# Patient Record
Sex: Male | Born: 1980
Health system: Southern US, Community
[De-identification: ages and names within clinical notes are randomized; demographics above are authoritative.]

## PROBLEM LIST (undated history)

## (undated) DIAGNOSIS — M419 Scoliosis, unspecified: Secondary | ICD-10-CM

## (undated) DIAGNOSIS — I10 Essential (primary) hypertension: Secondary | ICD-10-CM

---

## 2007-07-30 ENCOUNTER — Emergency Department (HOSPITAL_COMMUNITY): Admission: EM | Admit: 2007-07-30 | Discharge: 2007-07-30 | Payer: Self-pay | Admitting: Emergency Medicine

## 2010-01-31 ENCOUNTER — Emergency Department (HOSPITAL_COMMUNITY): Admission: EM | Admit: 2010-01-31 | Discharge: 2010-01-31 | Payer: Self-pay | Admitting: Emergency Medicine

## 2013-06-24 ENCOUNTER — Emergency Department (HOSPITAL_COMMUNITY)
Admission: EM | Admit: 2013-06-24 | Discharge: 2013-06-24 | Disposition: A | Discharge status: Home / Self Care | Payer: Self-pay | Attending: Emergency Medicine | Admitting: Emergency Medicine

## 2013-06-24 ENCOUNTER — Encounter (HOSPITAL_COMMUNITY): Payer: Self-pay | Admitting: Adult Health

## 2013-06-24 DIAGNOSIS — F17200 Nicotine dependence, unspecified, uncomplicated: Secondary | ICD-10-CM | POA: Insufficient documentation

## 2013-06-24 DIAGNOSIS — R21 Rash and other nonspecific skin eruption: Secondary | ICD-10-CM | POA: Insufficient documentation

## 2013-06-24 DIAGNOSIS — I10 Essential (primary) hypertension: Secondary | ICD-10-CM | POA: Insufficient documentation

## 2013-06-24 DIAGNOSIS — Z880 Allergy status to penicillin: Secondary | ICD-10-CM | POA: Insufficient documentation

## 2013-06-24 HISTORY — DX: Essential (primary) hypertension: I10

## 2013-06-24 MED ORDER — PREDNISONE 20 MG PO TABS: 40.0000 mg | ORAL_TABLET | Freq: Every day | ORAL | Status: DC

## 2013-06-24 MED ORDER — PERMETHRIN 5 % EX CREA: TOPICAL_CREAM | CUTANEOUS | Status: DC

## 2013-06-24 NOTE — ED Provider Notes (Signed)
CSN: [id number]     Arrival date & time 06/24/13  1800 History  This chart was scribed for non-physician practitioner Jaynie Crumble, PA-C working with Flint Melter, MD by Valera Castle, ED scribe. This patient was seen in room TR05C/TR05C and the patient's care was started at 6:36 PM.    Chief Complaint  Patient presents with  . Rash    The history is provided by the patient. No language interpreter was used.   HPI Comments: Corey Dunn is a 32 y.o. male who presents to the Emergency Department complaining of an itchy generalized rash, onset 10 days ago which is gradually spreading. He states there is a rash on his neck, groin, feet, legs, arms, hands, and back. He denies the presence of the rash in his mouth. His wife/partner has similar rash, but it is less severe. He states the rash looks similar to bug bites. He tried rubbing them down with EtOH, Aquaphor, and dermasil without relief. He states their kids do not appear to have the rash, but that they sleep in the same bed. He states he does have a h/o eczema. Denies any new products or medications. He denies having any other associated symptoms.    Past Medical History  Diagnosis Date  . Hypertension    History reviewed. No pertinent past surgical history. History reviewed. No pertinent family history. History  Substance Use Topics  . Smoking status: Current Every Day Smoker    Types: Cigarettes  . Smokeless tobacco: Not on file  . Alcohol Use: Yes    Review of Systems  Skin: Positive for rash.  All other systems reviewed and are negative.    Allergies  Penicillins  Home Medications   Current Outpatient Rx  Name  Route  Sig  Dispense  Refill  . calcium carbonate (TUMS - DOSED IN MG ELEMENTAL CALCIUM) 500 MG chewable tablet   Oral   Chew 1 tablet by mouth daily as needed for heartburn.         . cetirizine (ZYRTEC) 10 MG tablet   Oral   Take 10 mg by mouth daily as needed for allergies.         Marland Kitchen  ibuprofen (ADVIL,MOTRIN) 200 MG tablet   Oral   Take 800 mg by mouth every 6 (six) hours as needed for pain.          Triage Vitals: BP 138/90  Pulse 92  Temp(Src) 98.1 F (36.7 C) (Oral)  Resp 20  SpO2 97%  Physical Exam  Nursing note and vitals reviewed. Constitutional: He is oriented to person, place, and time. He appears well-developed and well-nourished.  HENT:  Head: Normocephalic and atraumatic.  No oral mucosa involvement.  Eyes: EOM are normal.  Neck: Normal range of motion. Neck supple.  Cardiovascular: Normal rate.   Pulmonary/Chest: Effort normal.  Musculoskeletal: Normal range of motion.  Neurological: He is alert and oriented to person, place, and time.  Skin: Skin is warm and dry.  Papular erythematous rash to bilateral hands webs of the fingers, axilla, back of the neck, bilateral groin, and bilateral feet and ankles.   Psychiatric: He has a normal mood and affect. His behavior is normal.    ED Course  Procedures (including critical care time) DIAGNOSTIC STUDIES: Oxygen Saturation is 97% on room air, normal by my interpretation.    COORDINATION OF CARE: 6:41 PM-Discussed treatment plan which includes prescriptions for prednisone and permethrin with pt at bedside and pt agreed to plan. Advised pt  that it could be eczema or scabies, and that scabies is contagious. Advised pt to stop applying EtOH to his skin. Advised pt to continue using aquaphor. Advised pt to f/u with a dermatologist if the rash does not improve.      Labs Review Labs Reviewed - No data to display Imaging Review No results found.  MDM   1. Rash    Patient with concerning rash for scabies. He's got pruritic papular rash to hands wrists, ankles, feet, groin, axilla. Patient also has history of eczema and Aleve this could be related. Will treat his rash with permethrin and prednisone. He is otherwise nontoxic appearing. He is afebrile. There is no meningismus. He does not appear ill  otherwise. We'll discharge him home with close followup.   Filed Vitals:   06/24/13 1804 06/24/13 1808  BP: 138/90   Pulse: 92   Temp:  98.1 F (36.7 C)  TempSrc: Oral Oral  Resp: 20   SpO2: 97%    I personally performed the services described in this documentation, which was scribed in my presence. The recorded information has been reviewed and is accurate.    Lottie Mussel, PA-C 06/24/13 1900

## 2013-06-24 NOTE — ED Notes (Signed)
Reports 10 days of itchy rash to hands, legs and back. Denies any one else in home with rash.

## 2013-06-25 NOTE — ED Provider Notes (Signed)
Medical screening examination/treatment/procedure(s) were performed by non-physician practitioner and as supervising physician I was immediately available for consultation/collaboration.  Flint Melter, MD 06/25/13 [phone]

## 2013-08-01 ENCOUNTER — Emergency Department (HOSPITAL_COMMUNITY): Payer: Self-pay

## 2013-08-01 ENCOUNTER — Encounter (HOSPITAL_COMMUNITY): Payer: Self-pay | Admitting: Emergency Medicine

## 2013-08-01 ENCOUNTER — Emergency Department (HOSPITAL_COMMUNITY)
Admission: EM | Admit: 2013-08-01 | Discharge: 2013-08-01 | Disposition: A | Discharge status: Home / Self Care | Payer: Self-pay | Attending: Emergency Medicine | Admitting: Emergency Medicine

## 2013-08-01 DIAGNOSIS — F17200 Nicotine dependence, unspecified, uncomplicated: Secondary | ICD-10-CM | POA: Insufficient documentation

## 2013-08-01 DIAGNOSIS — R0789 Other chest pain: Secondary | ICD-10-CM

## 2013-08-01 DIAGNOSIS — J40 Bronchitis, not specified as acute or chronic: Secondary | ICD-10-CM

## 2013-08-01 DIAGNOSIS — J209 Acute bronchitis, unspecified: Secondary | ICD-10-CM | POA: Insufficient documentation

## 2013-08-01 DIAGNOSIS — I10 Essential (primary) hypertension: Secondary | ICD-10-CM | POA: Insufficient documentation

## 2013-08-01 DIAGNOSIS — Z791 Long term (current) use of non-steroidal anti-inflammatories (NSAID): Secondary | ICD-10-CM | POA: Insufficient documentation

## 2013-08-01 DIAGNOSIS — M549 Dorsalgia, unspecified: Secondary | ICD-10-CM | POA: Insufficient documentation

## 2013-08-01 DIAGNOSIS — Z880 Allergy status to penicillin: Secondary | ICD-10-CM | POA: Insufficient documentation

## 2013-08-01 LAB — CBC WITH DIFFERENTIAL/PLATELET
Basophils Absolute: 0 10*3/uL (ref 0.0–0.1)
Basophils Relative: 0 % (ref 0–1)
Eosinophils Absolute: 0.1 10*3/uL (ref 0.0–0.7)
MCH: 34.9 pg — ABNORMAL HIGH (ref 26.0–34.0)
MCHC: 36.9 g/dL — ABNORMAL HIGH (ref 30.0–36.0)
Monocytes Relative: 10 % (ref 3–12)
Neutrophils Relative %: 63 % (ref 43–77)
Platelets: 211 10*3/uL (ref 150–400)
RDW: 13.4 % (ref 11.5–15.5)

## 2013-08-01 LAB — POCT I-STAT, CHEM 8
Glucose, Bld: 112 mg/dL — ABNORMAL HIGH (ref 70–99)
HCT: 44 % (ref 39.0–52.0)
Hemoglobin: 15 g/dL (ref 13.0–17.0)
Potassium: 3.6 mEq/L (ref 3.5–5.1)
Sodium: 142 mEq/L (ref 135–145)

## 2013-08-01 MED ORDER — AZITHROMYCIN 250 MG PO TABS: ORAL_TABLET | ORAL | Status: DC

## 2013-08-01 MED ORDER — ACETAMINOPHEN-CODEINE #3 300-30 MG PO TABS: 1.0000 | ORAL_TABLET | Freq: Four times a day (QID) | ORAL | Status: DC | PRN

## 2013-08-01 NOTE — ED Notes (Addendum)
C/o cough, CP and back pain, also sob, pain worse with certain positions, movement and cough. LS CTA. Mentions tingling in L arm. Alert, NAD, calm, interactive, skin W&D, resps e/u, speaking in clear complete sentences.

## 2013-08-01 NOTE — ED Notes (Signed)
MD aware of blood pressure trends.

## 2013-08-01 NOTE — ED Notes (Signed)
Pt here for c/o chest pain starting four days ago, pt states he awoke with chest pain sharp and radiating to back. Pt states he was having SOB and was sweating but currently is not.

## 2013-08-01 NOTE — ED Provider Notes (Signed)
CSN: [id number]     Arrival date & time 08/01/13  2135 History   First MD Initiated Contact with Patient 08/01/13 2149     Chief Complaint  Patient presents with  . Chest Pain  . Back Pain  . Cough  . Shortness of Breath   (Consider location/radiation/quality/duration/timing/severity/associated sxs/prior Treatment) HPI Comments: Patient presents with a several day history of chest congestion and cough. He states that the pain is in his left shoulder blade and left anterior chest. He reports low-grade fevers at home. His cough is productive of a yellowish sputum. He has no prior cardiac history. He denies any exertional symptoms. He does have a history of hypertension and is to be taking an ACE inhibitor. He has not taken this in a while as he has no doctor and cannot afford it.  Patient is a 32 y.o. male presenting with chest pain, back pain, cough, and shortness of breath. The history is provided by the patient.  Chest Pain Pain location:  L chest Pain quality: burning   Pain radiates to:  Does not radiate Pain radiates to the back: no   Pain severity:  Moderate Onset quality:  Gradual Duration:  5 days Timing:  Constant Progression:  Worsening Chronicity:  New Context comment:  Coughing Relieved by:  Nothing Worsened by:  Nothing tried Associated symptoms: back pain, cough and shortness of breath   Back Pain Associated symptoms: chest pain   Cough Associated symptoms: chest pain and shortness of breath   Shortness of Breath Associated symptoms: chest pain and cough     Past Medical History  Diagnosis Date  . Hypertension    History reviewed. No pertinent past surgical history. History reviewed. No pertinent family history. History  Substance Use Topics  . Smoking status: Current Every Day Smoker    Types: Cigarettes  . Smokeless tobacco: Not on file  . Alcohol Use: Yes    Review of Systems  Respiratory: Positive for cough and shortness of breath.    Cardiovascular: Positive for chest pain.  Musculoskeletal: Positive for back pain.  All other systems reviewed and are negative.    Allergies  Penicillins  Home Medications   Current Outpatient Rx  Name  Route  Sig  Dispense  Refill  . cetirizine (ZYRTEC) 10 MG tablet   Oral   Take 10 mg by mouth daily as needed for allergies.         Marland Kitchen ibuprofen (ADVIL,MOTRIN) 200 MG tablet   Oral   Take 200 mg by mouth every 6 (six) hours as needed for pain.          . naproxen sodium (ANAPROX) 220 MG tablet   Oral   Take 220 mg by mouth 2 (two) times daily with a meal.         . acetaminophen-codeine (TYLENOL #3) 300-30 MG per tablet   Oral   Take 1-2 tablets by mouth every 6 (six) hours as needed for pain.   15 tablet   0   . azithromycin (ZITHROMAX Z-PAK) 250 MG tablet      2 po day one, then 1 daily x 4 days   6 tablet   0    BP 168/106  Pulse 91  Temp(Src) 99.9 F (37.7 C) (Oral)  Resp 18  Ht 5\' 5"  (1.651 m)  Wt 170 lb (77.111 kg)  BMI 28.29 kg/m2  SpO2 98% Physical Exam  Nursing note and vitals reviewed. Constitutional: He is oriented to person, place, and  time. He appears well-developed and well-nourished. No distress.  HENT:  Head: Normocephalic and atraumatic.  Mouth/Throat: Oropharynx is clear and moist.  Neck: Normal range of motion. Neck supple.  Cardiovascular: Normal rate, regular rhythm and normal heart sounds.   No murmur heard. Pulmonary/Chest: Effort normal and breath sounds normal. No respiratory distress. He has no wheezes.  Abdominal: Soft. Bowel sounds are normal. He exhibits no distension. There is no tenderness.  Musculoskeletal: Normal range of motion. He exhibits no edema.  Neurological: He is alert and oriented to person, place, and time.  Skin: Skin is warm and dry. He is not diaphoretic.    ED Course  Procedures (including critical care time) Labs Review Labs Reviewed  CBC WITH DIFFERENTIAL - Abnormal; Notable for the following:     MCH 34.9 (*)    MCHC 36.9 (*)    All other components within normal limits  POCT I-STAT, CHEM 8 - Abnormal; Notable for the following:    Creatinine, Ser 1.40 (*)    Glucose, Bld 112 (*)    All other components within normal limits  POCT I-STAT TROPONIN I   Imaging Review Dg Chest 2 View  08/01/2013   CLINICAL DATA:  Chest and back pain for 4 days  EXAM: CHEST  2 VIEW  COMPARISON:  None.  FINDINGS: Generous heart size, size likely accentuated by scoliotic curvature and mild thoracic deformity. Mild linear opacities at the right base, were there is minimal posterior costophrenic sulcus blunting. No edema, infiltrate, or pneumothorax.  Thoracic dextroscoliosis, centered at T9-10.  IMPRESSION: 1. Mild right base atelectasis or scarring. 2. Thoracolumbar dextroscoliosis.   Electronically Signed   By: Tiburcio Pea M.D.   On: 08/01/2013 22:22    EKG Interpretation     Ventricular Rate:  110 PR Interval:  172 QRS Duration: 86 QT Interval:  314 QTC Calculation: 424 R Axis:   99 Text Interpretation:  Sinus tachycardia Possible Left atrial enlargement Rightward axis Cannot rule out Anterior infarct , age undetermined Abnormal ECG            MDM   1. Bronchitis   2. Atypical chest pain    Patient presents here with complaints of chest pain, low-grade fever, and productive cough. He is febrile upon presentation but chest x-ray does not reveal an infiltrate. I suspect his symptoms are infectious in nature due to the productive cough and fever however he expressed some concern that there may be a cardiac issue appear there are apparently some cardiac issues around his family but he is not sure what they are. I advised him if he is not improving I will give him contact information for [facility] cardiology that he can call and arrange a followup appointment if not improving.    Geoffery Lyons, MD 08/01/13 2340

## 2013-12-15 ENCOUNTER — Encounter (HOSPITAL_COMMUNITY): Payer: Self-pay | Admitting: Emergency Medicine

## 2013-12-15 ENCOUNTER — Emergency Department (HOSPITAL_COMMUNITY)
Admission: EM | Admit: 2013-12-15 | Discharge: 2013-12-15 | Disposition: A | Discharge status: Home / Self Care | Payer: Self-pay | Attending: Emergency Medicine | Admitting: Emergency Medicine

## 2013-12-15 DIAGNOSIS — Z880 Allergy status to penicillin: Secondary | ICD-10-CM | POA: Insufficient documentation

## 2013-12-15 DIAGNOSIS — K047 Periapical abscess without sinus: Secondary | ICD-10-CM | POA: Insufficient documentation

## 2013-12-15 DIAGNOSIS — F17200 Nicotine dependence, unspecified, uncomplicated: Secondary | ICD-10-CM | POA: Insufficient documentation

## 2013-12-15 DIAGNOSIS — I10 Essential (primary) hypertension: Secondary | ICD-10-CM | POA: Insufficient documentation

## 2013-12-15 MED ORDER — HYDROCODONE-ACETAMINOPHEN 5-325 MG PO TABS: 1.0000 | ORAL_TABLET | ORAL | Status: DC | PRN

## 2013-12-15 MED ORDER — CLINDAMYCIN HCL 150 MG PO CAPS
300.0000 mg | ORAL_CAPSULE | Freq: Once | ORAL | Status: AC
  Administered 2013-12-15: 300 mg via ORAL
  Filled 2013-12-15: qty 2

## 2013-12-15 MED ORDER — CLINDAMYCIN HCL 300 MG PO CAPS: 300.0000 mg | ORAL_CAPSULE | Freq: Four times a day (QID) | ORAL | Status: DC

## 2013-12-15 NOTE — Discharge Instructions (Signed)
Abscessed Tooth  An abscessed tooth is an infection around your tooth. It may be caused by holes or damage to the tooth (cavity) or a dental disease. An abscessed tooth causes mild to very bad pain in and around the tooth. See your dentist right away if you have tooth or gum pain.  HOME CARE   Take your medicine as told. Finish it even if you start to feel better.   Do not drive after taking pain medicine.   Rinse your mouth (gargle) often with salt water ( teaspoon salt in 8 ounces of warm water).   Do not apply heat to the outside of your face.  GET HELP RIGHT AWAY IF:    You have a temperature by mouth above 102 F (38.9 C), not controlled by medicine.   You have chills and a very bad headache.   You have problems breathing or swallowing.   Your mouth will not open.   You develop puffiness (swelling) on the neck or around the eye.   Your pain is not helped by medicine.   Your pain is getting worse instead of better.  MAKE SURE YOU:    Understand these instructions.   Will watch your condition.   Will get help right away if you are not doing well or get worse.  Document Released: 03/25/2008 Document Revised: 12/30/2011 Document Reviewed: 01/15/2011  ExitCare Patient Information 2014 [employer].

## 2013-12-15 NOTE — ED Notes (Signed)
States he has a cyst on his gum

## 2013-12-17 NOTE — ED Provider Notes (Signed)
CSN: [id number]     Arrival date & time 12/15/13  2009 History   First MD Initiated Contact with Patient 12/15/13 2015     Chief Complaint  Patient presents with  . Dental Pain     (Consider location/radiation/quality/duration/timing/severity/associated sxs/prior Treatment) HPI Comments: Corey Dunn is a 33 y.o. Male presenting with a 2 day history of dental pain and gingival swelling.   The patient has a history of  decay in his left lower 2nd premolar tooth which has recently started to cause increased  Pain and now swelling of the gumline.   There has been no fevers, chills, nausea or vomiting, also no complaint of difficulty swallowing, although chewing makes pain worse.  The patient has taken tylenol without relief of symptoms.         The history is provided by the patient.    Past Medical History  Diagnosis Date  . Hypertension    History reviewed. No pertinent past surgical history. History reviewed. No pertinent family history. History  Substance Use Topics  . Smoking status: Current Every Day Smoker    Types: Cigarettes  . Smokeless tobacco: Not on file  . Alcohol Use: Yes    Review of Systems  Constitutional: Negative for fever.  HENT: Positive for dental problem. Negative for facial swelling and sore throat.   Respiratory: Negative for shortness of breath.   Musculoskeletal: Negative for neck pain and neck stiffness.      Allergies  Penicillins  Home Medications   Current Outpatient Rx  Name  Route  Sig  Dispense  Refill  . clindamycin (CLEOCIN) 300 MG capsule   Oral   Take 1 capsule (300 mg total) by mouth every 6 (six) hours.   40 capsule   0   . HYDROcodone-acetaminophen (NORCO/VICODIN) 5-325 MG per tablet   Oral   Take 1 tablet by mouth every 4 (four) hours as needed for moderate pain.   15 tablet   0    BP 163/97  Pulse 88  Temp(Src) 97.9 F (36.6 C) (Oral)  Resp 20  Ht 5' 4.5" (1.638 m)  Wt 160 lb (72.576 kg)  BMI 27.05 kg/m2   SpO2 100% Physical Exam  Constitutional: He is oriented to person, place, and time. He appears well-developed and well-nourished. No distress.  HENT:  Head: Normocephalic and atraumatic.  Right Ear: Tympanic membrane and external ear normal.  Left Ear: Tympanic membrane and external ear normal.  Mouth/Throat: Oropharynx is clear and moist and mucous membranes are normal. No oral lesions. Dental abscesses present.  Deep cavity in occlusive surface of left lower 2nd premolar.  Gingival edema and fluctuance noted in the empty space behind this tooth (1st molar extracted).  No drainage.  Eyes: Conjunctivae are normal.  Neck: Normal range of motion. Neck supple.  Cardiovascular: Normal rate and normal heart sounds.   Pulmonary/Chest: Effort normal.  Abdominal: He exhibits no distension.  Musculoskeletal: Normal range of motion.  Lymphadenopathy:    He has no cervical adenopathy.  Neurological: He is alert and oriented to person, place, and time.  Skin: Skin is warm and dry. No erythema.  Psychiatric: He has a normal mood and affect.    ED Course  Dental Date/Time: 12/15/2013 8:30 PM Performed by: Burgess Amor Authorized by: Burgess Amor Risks and benefits: risks, benefits and alternatives were discussed Consent given by: patient Patient identity confirmed: verbally with patient Time out: Immediately prior to procedure a "time out" was called to verify the correct patient,  procedure, equipment, support staff and site/side marked as required. Comments: Patient given topical lidocaine gel.  Once numb,  Used #11 blade and made small incision at abscess site.  Moderate amount of purulent drainage obtained.  Pt tolerated well.   (including critical care time)   Labs Review Labs Reviewed - No data to display Imaging Review No results found.  EKG Interpretation   None       MDM   Final diagnoses:  Dental abscess    Pt prescribed clindamycin,hydrocodone.  Dental referrals  given.  The patient appears reasonably screened and/or stabilized for discharge and I doubt any other medical condition or other [facility] requiring further screening, evaluation, or treatment in the ED at this time prior to discharge.     Burgess AmorJulie Daisuke Bailey, PA-C 12/17/13 [phone]

## 2013-12-21 NOTE — ED Provider Notes (Signed)
Medical screening examination/treatment/procedure(s) were performed by non-physician practitioner and as supervising physician I was immediately available for consultation/collaboration.   EKG Interpretation None        Meya Clutter, MD 12/21/13 1605 

## 2014-04-01 ENCOUNTER — Encounter (HOSPITAL_COMMUNITY): Payer: Self-pay | Admitting: Emergency Medicine

## 2014-04-01 ENCOUNTER — Emergency Department (HOSPITAL_COMMUNITY)
Admission: EM | Admit: 2014-04-01 | Discharge: 2014-04-01 | Disposition: A | Discharge status: Home / Self Care | Payer: Self-pay | Attending: Emergency Medicine | Admitting: Emergency Medicine

## 2014-04-01 DIAGNOSIS — Z880 Allergy status to penicillin: Secondary | ICD-10-CM | POA: Insufficient documentation

## 2014-04-01 DIAGNOSIS — S6390XA Sprain of unspecified part of unspecified wrist and hand, initial encounter: Secondary | ICD-10-CM | POA: Insufficient documentation

## 2014-04-01 DIAGNOSIS — I10 Essential (primary) hypertension: Secondary | ICD-10-CM | POA: Insufficient documentation

## 2014-04-01 DIAGNOSIS — W230XXA Caught, crushed, jammed, or pinched between moving objects, initial encounter: Secondary | ICD-10-CM | POA: Insufficient documentation

## 2014-04-01 DIAGNOSIS — S63619A Unspecified sprain of unspecified finger, initial encounter: Secondary | ICD-10-CM

## 2014-04-01 DIAGNOSIS — S6000XA Contusion of unspecified finger without damage to nail, initial encounter: Secondary | ICD-10-CM | POA: Insufficient documentation

## 2014-04-01 DIAGNOSIS — Y9389 Activity, other specified: Secondary | ICD-10-CM | POA: Insufficient documentation

## 2014-04-01 DIAGNOSIS — F17200 Nicotine dependence, unspecified, uncomplicated: Secondary | ICD-10-CM | POA: Insufficient documentation

## 2014-04-01 DIAGNOSIS — Y929 Unspecified place or not applicable: Secondary | ICD-10-CM | POA: Insufficient documentation

## 2014-04-01 DIAGNOSIS — Z792 Long term (current) use of antibiotics: Secondary | ICD-10-CM | POA: Insufficient documentation

## 2014-04-01 MED ORDER — IBUPROFEN 400 MG PO TABS
600.0000 mg | ORAL_TABLET | Freq: Once | ORAL | Status: AC
  Administered 2014-04-01: 600 mg via ORAL
  Filled 2014-04-01: qty 2

## 2014-04-01 MED ORDER — IBUPROFEN 600 MG PO TABS: 600.0000 mg | ORAL_TABLET | Freq: Four times a day (QID) | ORAL | Status: DC | PRN

## 2014-04-01 NOTE — Discharge Instructions (Signed)
Finger Sprain A finger sprain is a tear in one of the strong, fibrous tissues that connect the bones (ligaments) in your finger. The severity of the sprain depends on how much of the ligament is torn. The tear can be either partial or complete. CAUSES  Often, sprains are a result of a fall or accident. If you extend your hands to catch an object or to protect yourself, the force of the impact causes the fibers of your ligament to stretch too much. This excess tension causes the fibers of your ligament to tear. SYMPTOMS  You may have some loss of motion in your finger. Other symptoms include:  Bruising.  Tenderness.  Swelling. DIAGNOSIS  In order to diagnose finger sprain, your caregiver will physically examine your finger or thumb to determine how torn the ligament is. Your caregiver may also suggest an X-ray exam of your finger to make sure no bones are broken. TREATMENT  If your ligament is only partially torn, treatment usually involves keeping the finger in a fixed position (immobilization) for a short period. To do this, your caregiver will apply a bandage, cast, or splint to keep your finger from moving until it heals. For a partially torn ligament, the healing process usually takes 2 to 3 weeks. If your ligament is completely torn, you may need surgery to reconnect the ligament to the bone. After surgery a cast or splint will be applied and will need to stay on your finger or thumb for 4 to 6 weeks while your ligament heals. HOME CARE INSTRUCTIONS  Keep your injured finger elevated, when possible, to decrease swelling.  To ease pain and swelling, apply ice to your joint twice a day, for 2 to 3 days:  Put ice in a plastic bag.  Place a towel between your skin and the bag.  Leave the ice on for 15 minutes.  Only take over-the-counter or prescription medicine for pain as directed by your caregiver.  Do not wear rings on your injured finger.  Do not leave your finger unprotected  until pain and stiffness go away (usually 3 to 4 weeks).  Do not allow your cast or splint to get wet. Cover your cast or splint with a plastic bag when you shower or bathe. Do not swim.  Your caregiver may suggest special exercises for you to do during your recovery to prevent or limit permanent stiffness. SEEK IMMEDIATE MEDICAL CARE IF:  Your cast or splint becomes damaged.  Your pain becomes worse rather than better. MAKE SURE YOU:  Understand these instructions.  Will watch your condition.  Will get help right away if you are not doing well or get worse. Document Released: 11/14/2004 Document Revised: 12/30/2011 Document Reviewed: 06/10/2011 [facility] Patient Information 2014 [place]Care, [place].  Contusion A contusion is a deep bruise. Contusions are the result of an injury that caused bleeding under the skin. The contusion may turn blue, purple, or yellow. Minor injuries will give you a painless contusion, but more severe contusions may stay painful and swollen for a few weeks.  CAUSES  A contusion is usually caused by a blow, trauma, or direct force to an area of the body. SYMPTOMS   Swelling and redness of the injured area.  Bruising of the injured area.  Tenderness and soreness of the injured area.  Pain. DIAGNOSIS  The diagnosis can be made by taking a history and physical exam. An X-ray, CT scan, or MRI may be needed to determine if there were any associated  injuries, such as fractures. TREATMENT  Specific treatment will depend on what area of the body was injured. In general, the best treatment for a contusion is resting, icing, elevating, and applying cold compresses to the injured area. Over-the-counter medicines may also be recommended for pain control. Ask your caregiver what the best treatment is for your contusion. HOME CARE INSTRUCTIONS   Put ice on the injured area.  Put ice in a plastic bag.  Place a towel between your skin and the bag.  Leave the ice on  for 15-20 minutes, 03-04 times a day.  Only take over-the-counter or prescription medicines for pain, discomfort, or fever as directed by your caregiver. Your caregiver may recommend avoiding anti-inflammatory medicines (aspirin, ibuprofen, and naproxen) for 48 hours because these medicines may increase bruising.  Rest the injured area.  If possible, elevate the injured area to reduce swelling. SEEK IMMEDIATE MEDICAL CARE IF:   You have increased bruising or swelling.  You have pain that is getting worse.  Your swelling or pain is not relieved with medicines. MAKE SURE YOU:   Understand these instructions.  Will watch your condition.  Will get help right away if you are not doing well or get worse. Document Released: 07/17/2005 Document Revised: 12/30/2011 Document Reviewed: 08/12/2011 [facility]ExitCare Patient Information 2014 [place]Care, [place]. RICE: Routine Care for Injuries The routine care of many injuries includes Rest, Ice, Compression, and Elevation (RICE). HOME CARE INSTRUCTIONS  Rest is needed to allow your body to heal. Routine activities can usually be resumed when comfortable. Injured tendons and bones can take up to 6 weeks to heal. Tendons are the cord-like structures that attach muscle to bone.  Ice following an injury helps keep the swelling down and reduces pain.  Put ice in a plastic bag.  Place a towel between your skin and the bag.  Leave the ice on for 15-20 minutes, 03-04 times a day. Do this while awake, for the first 24 to 48 hours. After that, continue as directed by your caregiver.  Compression helps keep swelling down. It also gives support and helps with discomfort. If an elastic bandage has been applied, it should be removed and reapplied every 3 to 4 hours. It should not be applied tightly, but firmly enough to keep swelling down. Watch fingers or toes for swelling, bluish discoloration, coldness, numbness, or excessive pain. If any of these problems occur,  remove the bandage and reapply loosely. Contact your caregiver if these problems continue.  Elevation helps reduce swelling and decreases pain. With extremities, such as the arms, hands, legs, and feet, the injured area should be placed near or above the level of the heart, if possible. SEEK IMMEDIATE MEDICAL CARE IF:  You have persistent pain and swelling.  You develop redness, numbness, or unexpected weakness.  Your symptoms are getting worse rather than improving after several days. These symptoms may indicate that further evaluation or further X-rays are needed. Sometimes, X-rays may not show a small broken bone (fracture) until 1 week or 10 days later. Make a follow-up appointment with your caregiver. Ask when your X-ray results will be ready. Make sure you get your X-ray results. Document Released: 01/19/2001 Document Revised: 12/30/2011 Document Reviewed: 03/08/2011 [facility]ErExitCare Patient Information 2014 [place]ExitCare, [place]LLC.

## 2014-04-01 NOTE — ED Provider Notes (Signed)
CSN: [id number]     Arrival date & time 04/01/14  0114 History   First MD Initiated Contact with Patient 04/01/14 0157     No chief complaint on file.    (Consider location/radiation/quality/duration/timing/severity/associated sxs/prior Treatment) HPI Comments: Pt comes in with right long finger injury. States that he jammed the finger few days bag while fluffing the pillow. Pt has pain to the finger, mostly over the joints. Able to move over the joint fine. + mild swelling.   The history is provided by the patient.    Past Medical History  Diagnosis Date  . Hypertension    History reviewed. No pertinent past surgical history. History reviewed. No pertinent family history. History  Substance Use Topics  . Smoking status: Current Every Day Smoker -- 1.00 packs/day    Types: Cigarettes  . Smokeless tobacco: Not on file  . Alcohol Use: Yes    Review of Systems  Musculoskeletal: Positive for arthralgias and myalgias.  Skin: Negative for wound.      Allergies  Penicillins  Home Medications   Prior to Admission medications   Medication Sig Start Date End Date Taking? Authorizing Provider  clindamycin (CLEOCIN) 300 MG capsule Take 1 capsule (300 mg total) by mouth every 6 (six) hours. 12/15/13   Burgess AmorJulie Idol, PA-C  HYDROcodone-acetaminophen (NORCO/VICODIN) 5-325 MG per tablet Take 1 tablet by mouth every 4 (four) hours as needed for moderate pain. 12/15/13   Burgess AmorJulie Idol, PA-C  ibuprofen (ADVIL,MOTRIN) 600 MG tablet Take 1 tablet (600 mg total) by mouth every 6 (six) hours as needed. 04/01/14   Kenley Troop, MD   BP 153/106  Pulse 82  Resp 20  SpO2 97% Physical Exam  Nursing note and vitals reviewed. Neck: Neck supple.  Pulmonary/Chest: Effort normal.  Musculoskeletal:  Right hand - long finger has PIP tenderness and swelling. ROM over pip is fine. Cap refill is normal.  Neurological: He is alert.    ED Course  Procedures (including critical care time) Labs  Review Labs Reviewed - No data to display  Imaging Review No results found.   EKG Interpretation None      MDM   Final diagnoses:  Contusion of finger, right  Finger sprain    SPLINT APPLICATION Date/Time: 4:33 AM Authorized by: Derwood KaplanNanavati, Arcadia Gorgas Consent: Verbal consent obtained. Risks and benefits: risks, benefits and alternatives were discussed Consent given by: patient Splint applied by: RN Location details: Right long finger Splint type: Static finger splint Supplies used: splint, tape Post-procedure: The splinted body part was neurovascularly unchanged following the procedure. Patient tolerance: Patient tolerated the procedure well with no immediate complications.   Pt has what appears to be a simple PIP contusion/ soft tissue sprain/strain. Will splint, RICE advised. No reason for Xrays given no gross deformity, and intact ROM and no change in tx even if there is a small fx.   Derwood KaplanAnkit Mico Spark, MD 04/01/14 [phone]

## 2014-04-01 NOTE — ED Notes (Signed)
Pt reports jamming his finger on Tuesday. States he continues to have pain in right middle finger.

## 2014-04-19 ENCOUNTER — Emergency Department (HOSPITAL_COMMUNITY)
Admission: EM | Admit: 2014-04-19 | Discharge: 2014-04-19 | Disposition: A | Discharge status: Home / Self Care | Payer: Self-pay | Attending: Emergency Medicine | Admitting: Emergency Medicine

## 2014-04-19 ENCOUNTER — Encounter (HOSPITAL_COMMUNITY): Payer: Self-pay | Admitting: Emergency Medicine

## 2014-04-19 DIAGNOSIS — F17200 Nicotine dependence, unspecified, uncomplicated: Secondary | ICD-10-CM | POA: Insufficient documentation

## 2014-04-19 DIAGNOSIS — Z791 Long term (current) use of non-steroidal anti-inflammatories (NSAID): Secondary | ICD-10-CM | POA: Insufficient documentation

## 2014-04-19 DIAGNOSIS — R42 Dizziness and giddiness: Secondary | ICD-10-CM | POA: Insufficient documentation

## 2014-04-19 DIAGNOSIS — Z880 Allergy status to penicillin: Secondary | ICD-10-CM | POA: Insufficient documentation

## 2014-04-19 DIAGNOSIS — I10 Essential (primary) hypertension: Secondary | ICD-10-CM | POA: Insufficient documentation

## 2014-04-19 LAB — I-STAT CHEM 8, ED
BUN: 17 mg/dL (ref 6–23)
CALCIUM ION: 1.17 mmol/L (ref 1.12–1.23)
CHLORIDE: 103 meq/L (ref 96–112)
Creatinine, Ser: 1.3 mg/dL (ref 0.50–1.35)
GLUCOSE: 119 mg/dL — AB (ref 70–99)
HEMATOCRIT: 47 % (ref 39.0–52.0)
Hemoglobin: 16 g/dL (ref 13.0–17.0)
POTASSIUM: 3.6 meq/L — AB (ref 3.7–5.3)
Sodium: 140 mEq/L (ref 137–147)
TCO2: 25 mmol/L (ref 0–100)

## 2014-04-19 MED ORDER — LISINOPRIL 5 MG PO TABS: 5.0000 mg | ORAL_TABLET | Freq: Every day | ORAL | Status: DC

## 2014-04-19 MED ORDER — LISINOPRIL 10 MG PO TABS
5.0000 mg | ORAL_TABLET | Freq: Once | ORAL | Status: AC
  Administered 2014-04-19: 5 mg via ORAL
  Filled 2014-04-19: qty 1

## 2014-04-19 NOTE — ED Provider Notes (Signed)
CSN: [id number]     Arrival date & time 04/19/14  0137 History   First MD Initiated Contact with Patient 04/19/14 0207     Chief Complaint  Patient presents with  . Hypertension     (Consider location/radiation/quality/duration/timing/severity/associated sxs/prior Treatment) HPI Patient states he has a history of hypertension and used to be on lisinopril. He has not been taking it for the past 2 years. He has not followed by primary Dr. He states he had episodes of feeling flushed and seeing spots today. He says he had similar symptoms when his blood pressure goes up. He had no focal weakness or numbness. Had no nausea or vomiting. He states he is feeling much better now in the emergency department. He has no visual changes. Denies any chest pain or shortness of breath.   Past Medical History  Diagnosis Date  . Hypertension    History reviewed. No pertinent past surgical history. History reviewed. No pertinent family history. History  Substance Use Topics  . Smoking status: Current Every Day Smoker -- 1.00 packs/day    Types: Cigarettes  . Smokeless tobacco: Never Used  . Alcohol Use: Yes    Review of Systems  Constitutional: Negative for fever and chills.  Respiratory: Negative for cough and shortness of breath.   Cardiovascular: Negative for chest pain, palpitations and leg swelling.  Gastrointestinal: Negative for nausea, vomiting, abdominal pain and diarrhea.  Musculoskeletal: Negative for back pain, myalgias, neck pain and neck stiffness.  Skin: Negative for rash and wound.  Neurological: Positive for dizziness, light-headedness and headaches. Negative for syncope, weakness and numbness.  All other systems reviewed and are negative.     Allergies  Amoxicillin; Ampicillin; and Penicillins  Home Medications   Prior to Admission medications   Medication Sig Start Date End Date Taking? Authorizing Provider  ibuprofen (ADVIL,MOTRIN) 600 MG tablet Take 1 tablet (600 mg  total) by mouth every 6 (six) hours as needed. 04/01/14   Ankit Rhunette CroftNanavati, MD   BP 139/81  Pulse 64  Temp(Src) 98.3 F (36.8 C) (Oral)  Resp 16  Ht 5\' 4"  (1.626 m)  Wt 160 lb (72.576 kg)  BMI 27.45 kg/m2  SpO2 98% Physical Exam  Nursing note and vitals reviewed. Constitutional: He is oriented to person, place, and time. He appears well-developed and well-nourished. No distress.  HENT:  Head: Normocephalic and atraumatic.  Mouth/Throat: Oropharynx is clear and moist.  Eyes: EOM are normal. Pupils are equal, round, and reactive to light.  Neck: Normal range of motion. Neck supple.  Cardiovascular: Normal rate and regular rhythm.   Pulmonary/Chest: Effort normal and breath sounds normal. No respiratory distress. He has no wheezes. He has no rales. He exhibits no tenderness.  Abdominal: Soft. Bowel sounds are normal. He exhibits no distension and no mass. There is no tenderness. There is no rebound and no guarding.  Musculoskeletal: Normal range of motion. He exhibits no edema and no tenderness.  Neurological: He is alert and oriented to person, place, and time.  Patient is alert and oriented x3 with clear, goal oriented speech. Patient has 5/5 motor in all extremities. Sensation is intact to light touch. Bilateral finger-to-nose is normal with no signs of dysmetria. Patient has a normal gait and walks without assistance.   Skin: Skin is warm and dry. No rash noted. No erythema.  Psychiatric: He has a normal mood and affect. His behavior is normal.    ED Course  Procedures (including critical care time) Labs Review Labs Reviewed  I-STAT CHEM 8, ED - Abnormal; Notable for the following:    Potassium 3.6 (*)    Glucose, Bld 119 (*)    All other components within normal limits    Imaging Review No results found.   EKG Interpretation None      MDM   Final diagnoses:  None    Patient is very well-appearing. His blood pressure has improved in the emergency department. He  continues to have a normal neurologic exam. We'll start back on his lisinopril. He is advised to obtain a primary MD that can manage his ongoing hypertension. Return precautions have been given and the patient has voiced understanding.    Loren Raceravid Yelverton, MD 04/19/14 [phone]

## 2014-04-19 NOTE — Discharge Instructions (Signed)
Hypertension °Hypertension, commonly called high blood pressure, is when the force of blood pumping through your arteries is too strong. Your arteries are the blood vessels that carry blood from your heart throughout your body. A blood pressure reading consists of a higher number over a lower number, such as 110/72. The higher number (systolic) is the pressure inside your arteries when your heart pumps. The lower number (diastolic) is the pressure inside your arteries when your heart relaxes. Ideally you want your blood pressure below 120/80. °Hypertension forces your heart to work harder to pump blood. Your arteries may become narrow or stiff. Having hypertension puts you at risk for heart disease, stroke, and other problems.  °RISK FACTORS °Some risk factors for high blood pressure are controllable. Others are not.  °Risk factors you cannot control include:  °· Race. You may be at higher risk if you are African American. °· Age. Risk increases with age. °· Gender. Men are at higher risk than women before age 45 years. After age 65, women are at higher risk than men. °Risk factors you can control include: °· Not getting enough exercise or physical activity. °· Being overweight. °· Getting too much fat, sugar, calories, or salt in your diet. °· Drinking too much alcohol. °SIGNS AND SYMPTOMS °Hypertension does not usually cause signs or symptoms. Extremely high blood pressure (hypertensive crisis) may cause headache, anxiety, shortness of breath, and nosebleed. °DIAGNOSIS  °To check if you have hypertension, your health care provider will measure your blood pressure while you are seated, with your arm held at the level of your heart. It should be measured at least twice using the same arm. Certain conditions can cause a difference in blood pressure between your right and left arms. A blood pressure reading that is higher than normal on one occasion does not mean that you need treatment. If one blood pressure reading  is high, ask your health care provider about having it checked again. °TREATMENT  °Treating high blood pressure includes making lifestyle changes and possibly taking medication. Living a healthy lifestyle can help lower high blood pressure. You may need to change some of your habits. °Lifestyle changes may include: °· Following the DASH diet. This diet is high in fruits, vegetables, and whole grains. It is low in salt, red meat, and added sugars. °· Getting at least 2 1/2 hours of brisk physical activity every week. °· Losing weight if necessary. °· Not smoking. °· Limiting alcoholic beverages. °· Learning ways to reduce stress. ° If lifestyle changes are not enough to get your blood pressure under control, your health care provider may prescribe medicine. You may need to take more than one. Work closely with your health care provider to understand the risks and benefits. °HOME CARE INSTRUCTIONS °· Have your blood pressure rechecked as directed by your health care provider.   °· Only take medicine as directed by your health care provider. Follow the directions carefully. Blood pressure medicines must be taken as prescribed. The medicine does not work as well when you skip doses. Skipping doses also puts you at risk for problems.   °· Do not smoke.   °· Monitor your blood pressure at home as directed by your health care provider.  °SEEK MEDICAL CARE IF:  °· You think you are having a reaction to medicines taken. °· You have recurrent headaches or feel dizzy. °· You have swelling in your ankles. °· You have trouble with your vision. °SEEK IMMEDIATE MEDICAL CARE IF: °· You develop a severe headache or   confusion. °· You have unusual weakness, numbness, or feel faint. °· You have severe chest or abdominal pain. °· You vomit repeatedly. °· You have trouble breathing. °MAKE SURE YOU:  °· Understand these instructions. °· Will watch your condition. °· Will get help right away if you are not doing well or get  worse. °Document Released: 10/07/2005 Document Revised: 10/12/2013 Document Reviewed: 07/30/2013 °ExitCare® Patient Information ©2015 [employer]. This information is not intended to replace advice given to you by your health care provider. Make sure you discuss any questions you have with your health care provider. ° ° °Emergency Department Resource Guide °1) Find a Doctor and Pay Out of Pocket °Although you won't have to find out who is covered by your insurance plan, it is a good idea to ask around and get recommendations. You will then need to call the office and see if the doctor you have chosen will accept you as a new patient and what types of options they offer for patients who are self-pay. Some doctors offer discounts or will set up payment plans for their patients who do not have insurance, but you will need to ask so you aren't surprised when you get to your appointment. ° °2) Contact Your Local Health Department °Not all health departments have doctors that can see patients for sick visits, but many do, so it is worth a call to see if yours does. If you don't know where your local health department is, you can check in your phone book. The CDC also has a tool to help you locate your state's health department, and many state websites also have listings of all of their local health departments. ° °3) Find a Walk-in Clinic °If your illness is not likely to be very severe or complicated, you may want to try a walk in clinic. These are popping up all over the country in pharmacies, drugstores, and shopping centers. They're usually staffed by nurse practitioners or physician assistants that have been trained to treat common illnesses and complaints. They're usually fairly quick and inexpensive. However, if you have serious medical issues or chronic medical problems, these are probably not your best option. ° °No Primary Care Doctor: °- Call [facility] at  832-8000 - they can help you locate a primary  care doctor that  accepts your insurance, provides certain services, etc. °- Physician Referral Service- 1-800-533-3463 ° °Chronic Pain Problems: °Organization         Address  Phone   Notes  °[facility]  (336) 297-2271 Patients need to be referred by their primary care doctor.  ° °Medication Assistance: °Organization         Address  Phone   Notes  °[place] [facility] [address] °[place], [place] [address] (336) 641-8030 --Must be a resident of [place] °-- Must have NO insurance coverage whatsoever (no Medicaid/ Medicare, etc.) °-- The pt. MUST have a primary care doctor that directs their care regularly and follows them in the community °  °[facility]  (866) 331-1348   °[employer]  (888) 892-1162   ° °Agencies that provide inexpensive medical care: °Organization         Address  Phone   Notes  °[facility]  (336) 832-8035   °[facility]    (336) 832-7272   °[facility] [address] °[place], [place] [address] (336) 832-4777   °[facility] [address], °[place] (336) 271-4999   °[facility]    (336) 373-0678   °[facility]    (336) 272-1050   °[facility] ° [address], [place] Phone:  (336) 832-4444, Fax:  (336) 832-4440 Hours of Operation:  9 am - 6 pm, M-F.  Also accepts Medicaid/Medicare and self-pay.  °[facility] ° [address], [place] Phone: (336) 832-3150, Fax: (336) [phone]. Hours of Operation:  8:30 am - 5:30 pm, M-F.  Also accepts Medicaid and self-pay.  °[facility] [place] [address], [place] Phone: (336) 878-6027   °[facility] [address], [place], [place] (336)723-1848, Ext. 123 Mondays & Thursdays: 7-9 AM.  First 15 patients are seen on a first come, first serve basis. °  ° °Medicaid-accepting [place]  Providers: ° °Organization         Address  Phone   Notes  °[facility] [address], [place] (336) 641-2100 Also accepts self-pay patients.  °[facility] [address], [place] ° (336) 856-9996   °[facility] [address], [place] (336) 288-8857   °[facility] [address] [place] [address], [place] (336) 299-7000   °Veita Bland [address], [place]  ° (336) 373-1557 Only accepts [place] Access Medicaid patients after they have their name applied to their card.  ° °Self-Pay (no insurance) in [place]: ° °Organization         Address  Phone   Notes  °Sickle Cell Patients, [facility] [address], [place] (336) 832-1970   °[facility] [address], [place] (336) 832-4400   °[facility] ° [address], [place] (336) 992-4800   °[facility]/Dr. Osei-Bonsu ° [address] [place] [address], [place] or [address], [place] (336) 841-8500 Phone number for both [place] and [place] locations is the same.  °[facility] [address], [place] (336) 299-0000   °[facility] [address] [place] [address], [place] or [address] (336) 852-7530 °(336) 878-2260   °[facility] [address], [place] (336) 350-1642, phone; (336) 294-5005, fax Sees patients 1st and 3rd Saturday of every month.  Must not qualify for public or private insurance (i.e. Medicaid, Medicare, [place] Health Choice, Veterans' Benefits) • Household income should be no more than 200% of the poverty level •The clinic cannot treat you if you are pregnant or think you are pregnant • Sexually transmitted diseases are not treated at the clinic.  ° ° °Dental Care: °Organization         Address  Phone  Notes  °[facility] [address], [place] (336) 641-6152 Accepts children up to age 21 who are enrolled in Medicaid or [place] Health Choice; pregnant women with a Medicaid card; and children who have applied for Medicaid or [place] Health Choice, but were declined, whose parents can pay a reduced fee at time of service.  °[facility] [place]  [address], [place] (336) 641-7733 Accepts children up to age 21 who are enrolled in Medicaid or [place] Health Choice; pregnant women with a Medicaid card; and children who have applied for Medicaid   or [place] Health Choice, but were declined, whose parents can pay a reduced fee at time of service.  °[facility] ° [address], [place] (336) 641-4533 Patients are seen by appointment only. Walk-ins are not accepted. [facility] will see patients 18 years of age and older. °Monday - Tuesday (8am-5pm) °Most Wednesdays (8:30-5pm) °$30 per visit, cash only  °[facility] ° [address], [place] (336) 641-4533 Patients are seen by appointment only. Walk-ins are not accepted. [facility] will see patients 18 years of age and older. °One Wednesday Evening (Monthly: Volunteer Based).  $30 per visit, cash only  °[facility]  (919) 537-3737 for adults; Children under age 4, call Graduate Pediatric Dentistry at ([phone]) [phone]. Children aged 4-14, please call (919) [phone] to request a pediatric application. ° Dental services are provided in all areas of dental care including fillings, crowns and bridges, complete and partial dentures, implants, gum treatment, root canals, and extractions. Preventive care is also provided. Treatment is provided to both adults and children. °Patients are selected via a lottery and there is often a waiting list. °  °[facility] [address], °[place] ° (336) 763-8833 [email or web address] °  °[facility]  [address], [place], [place] (336)723-1848, Ext. 123 Second and Fourth Thursday of each month, opens at 6:30 AM; Clinic ends at 9 AM.  Patients are seen on a first-come first-served basis, and a limited number are seen during each clinic.  ° °[facility] ° [address], [place], [place] (336) 723-7904   Eligibility Requirements °You must have lived in [place], [place], or [place] counties for at least the last three months. °  You cannot be eligible for state or federal sponsored healthcare insurance, including Veterans Administration, Medicaid, or Medicare. °  You generally cannot be eligible for healthcare insurance through your employer.  °  How to apply: °Eligibility screenings are held every Tuesday and Wednesday afternoon from 1:00 pm until 4:00 pm. You do not need an appointment for the interview!  °[facility] [address], [place], [place] 336-631-2330   °[facility]  336-342-8273   °[facility]  336-703-3100   ° [facility]  336-570-6415   ° °Behavioral Health Resources in the Community: °Intensive Outpatient Programs °Organization         Address  Phone  Notes  °[facility] [address], [place], [place] 336-878-6098   °[facility] [address], [place], [place] 336-832-9800   °[facility] [address], [place], [place] ° 336-882-2125   °[facility] [address],  °[place], [place] 1-800-853-5163 or 336-641-4981   °Substance Abuse Resources °Organization         Address  Phone  Notes  °[facility]  336-882-2125   °[facility]  336-784-9470   °[facility]  336-285-9073   °[facility]  336-845-3988   °Residential & Outpatient Substance Abuse Program  1-800-659-3381   °Psychological Services °Organization         Address  Phone  Notes  °[facility]  336- 832-9600    °[facility]  336- 378-7881   °[facility] [address], [place] 1-800-853-5163 or 336-641-4981   ° °Mobile Crisis Teams °Organization         Address    Phone  Notes  °[facility], Mobile Crisis Care Unit  1-877-626-1772   °Assertive °[facility] ° [address] [place], [place] 336-834-9664   °Sharon DeEsch [address] °[place] 336-554-5454   ° °Self-Help/Support Groups °Organization         Address  Phone             Notes  °[facility] - variety of support groups  336- 373-1402 Call for more information  °Narcotics Anonymous (NA), [facility] [address], °[place]  2 meetings at this location  ° °Residential Treatment Programs °Organization         Address  Phone  Notes  °[facility] [address],    °[place]  1-866-801-8205   °[facility] ° [address], [place], [place] 704-293-8524   °[facility] [address], [place] 336-845-3988 Admissions: 8am-3pm M-F  °[facility] [address]    °[place], [place] 336-841-1104   °[facility] [address], [place], [place] 336-379-7146   °[facility] [address]  °[place], [place] 336-285-9073   °[facility] - Intensive Outpatient [address], [place], [place] 336-852-3033   °[facility] [address]  °[place], [place] 1-877-615-2722 or 336-784-9470   °[facility] [address] [place], [place] 336-227-7417 Accepts Medicaid  °[facility] [address]  °[place] 1-800-659-3381 Substance Abuse/Addiction Treatment  ° °[place] Behavioral Health Resources °Organization         Address  Phone  Notes  °[facility]  (888) 581-9988   °Julie Brannon, PhD [address] [place], [place]   (336) 349-5553 or (336) 951-0000    °[facility]   [address] °[place], [place] (336) 349-4454   °[facility] [address], [place], [place] (336) 342-8316 Insurance/Medicaid/sponsorship through [facility]  °[facility] [address]                                    [place], [place] (336) 342-8316 Therapy/tele-psych/case  °[facility] [address]  ° [place], [place] (336) 349-2233    °Dr. Arfeen  (336) 349-4544   °[facility] [place]  [employer] [place] [facility] 1) [address], [place] °2) [address], [place] °3)  [address], [place] (336) 349-3220 °(336) 342-7768 ° °(336) 342-8140   °[place] [facility] (336) 342-1394 or (336) 342-3537 (After Hours)    ° ° ° °

## 2014-04-19 NOTE — ED Notes (Signed)
Dr Yelverton at bedside.  

## 2014-04-19 NOTE — ED Notes (Signed)
Patient presents stating he started out with a headache and flush feeling.  Is to be on Lisinopril but has not taken it for about 2 years.  History of HTN in family

## 2014-12-26 ENCOUNTER — Emergency Department (HOSPITAL_BASED_OUTPATIENT_CLINIC_OR_DEPARTMENT_OTHER): Payer: Self-pay

## 2014-12-26 ENCOUNTER — Encounter (HOSPITAL_BASED_OUTPATIENT_CLINIC_OR_DEPARTMENT_OTHER): Payer: Self-pay | Admitting: *Deleted

## 2014-12-26 ENCOUNTER — Emergency Department (HOSPITAL_BASED_OUTPATIENT_CLINIC_OR_DEPARTMENT_OTHER)
Admission: EM | Admit: 2014-12-26 | Discharge: 2014-12-26 | Disposition: A | Discharge status: Home / Self Care | Payer: Self-pay | Attending: Emergency Medicine | Admitting: Emergency Medicine

## 2014-12-26 DIAGNOSIS — Z720 Tobacco use: Secondary | ICD-10-CM | POA: Insufficient documentation

## 2014-12-26 DIAGNOSIS — R05 Cough: Secondary | ICD-10-CM | POA: Insufficient documentation

## 2014-12-26 DIAGNOSIS — I10 Essential (primary) hypertension: Secondary | ICD-10-CM | POA: Insufficient documentation

## 2014-12-26 DIAGNOSIS — Z880 Allergy status to penicillin: Secondary | ICD-10-CM | POA: Insufficient documentation

## 2014-12-26 DIAGNOSIS — M419 Scoliosis, unspecified: Secondary | ICD-10-CM | POA: Insufficient documentation

## 2014-12-26 DIAGNOSIS — Z79899 Other long term (current) drug therapy: Secondary | ICD-10-CM | POA: Insufficient documentation

## 2014-12-26 DIAGNOSIS — R0981 Nasal congestion: Secondary | ICD-10-CM | POA: Insufficient documentation

## 2014-12-26 DIAGNOSIS — R059 Cough, unspecified: Secondary | ICD-10-CM

## 2014-12-26 LAB — BASIC METABOLIC PANEL
ANION GAP: 2 — AB (ref 5–15)
BUN: 16 mg/dL (ref 6–23)
CALCIUM: 8.6 mg/dL (ref 8.4–10.5)
CHLORIDE: 108 mmol/L (ref 96–112)
CO2: 26 mmol/L (ref 19–32)
CREATININE: 1.29 mg/dL (ref 0.50–1.35)
GFR calc Af Amer: 83 mL/min — ABNORMAL LOW (ref >90)
GFR calc non Af Amer: 72 mL/min — ABNORMAL LOW (ref >90)
Glucose, Bld: 97 mg/dL (ref 70–99)
Potassium: 3.6 mmol/L (ref 3.5–5.1)
SODIUM: 136 mmol/L (ref 135–145)

## 2014-12-26 LAB — CBC WITH DIFFERENTIAL/PLATELET
BASOS ABS: 0 10*3/uL (ref 0.0–0.1)
BASOS PCT: 0 % (ref 0–1)
EOS ABS: 0 10*3/uL (ref 0.0–0.7)
Eosinophils Relative: 0 % (ref 0–5)
HEMATOCRIT: 42.8 % (ref 39.0–52.0)
Hemoglobin: 15.1 g/dL (ref 13.0–17.0)
LYMPHS ABS: 1.2 10*3/uL (ref 0.7–4.0)
LYMPHS PCT: 16 % (ref 12–46)
MCH: 33.9 pg (ref 26.0–34.0)
MCHC: 35.3 g/dL (ref 30.0–36.0)
MCV: 96 fL (ref 78.0–100.0)
Monocytes Absolute: 1 10*3/uL (ref 0.1–1.0)
Monocytes Relative: 13 % — ABNORMAL HIGH (ref 3–12)
NEUTROS ABS: 5.6 10*3/uL (ref 1.7–7.7)
Neutrophils Relative %: 71 % (ref 43–77)
PLATELETS: 206 10*3/uL (ref 150–400)
RBC: 4.46 MIL/uL (ref 4.22–5.81)
RDW: 12.7 % (ref 11.5–15.5)
WBC: 7.9 10*3/uL (ref 4.0–10.5)

## 2014-12-26 LAB — URINALYSIS, ROUTINE W REFLEX MICROSCOPIC
BILIRUBIN URINE: NEGATIVE
GLUCOSE, UA: NEGATIVE mg/dL
HGB URINE DIPSTICK: NEGATIVE
KETONES UR: 15 mg/dL — AB
Leukocytes, UA: NEGATIVE
Nitrite: NEGATIVE
PH: 7.5 (ref 5.0–8.0)
Protein, ur: NEGATIVE mg/dL
Specific Gravity, Urine: 1.031 — ABNORMAL HIGH (ref 1.005–1.030)
Urobilinogen, UA: 1 mg/dL (ref 0.0–1.0)

## 2014-12-26 MED ORDER — DOXYCYCLINE HYCLATE 100 MG PO TABS: 100.0000 mg | ORAL_TABLET | Freq: Two times a day (BID) | ORAL | Status: DC

## 2014-12-26 MED ORDER — LISINOPRIL 5 MG PO TABS: 5.0000 mg | ORAL_TABLET | Freq: Every day | ORAL | Status: DC

## 2014-12-26 MED ORDER — IBUPROFEN 400 MG PO TABS
400.0000 mg | ORAL_TABLET | Freq: Once | ORAL | Status: AC
  Administered 2014-12-26: 400 mg via ORAL
  Filled 2014-12-26: qty 1

## 2014-12-26 MED ORDER — ACETAMINOPHEN 325 MG PO TABS
650.0000 mg | ORAL_TABLET | Freq: Once | ORAL | Status: AC
  Administered 2014-12-26: 650 mg via ORAL
  Filled 2014-12-26: qty 2

## 2014-12-26 MED ORDER — HYDROCODONE-HOMATROPINE 5-1.5 MG/5ML PO SYRP: 5.0000 mL | ORAL_SOLUTION | ORAL | Status: DC | PRN

## 2014-12-26 MED ORDER — DOXYCYCLINE HYCLATE 100 MG PO TABS: 100.0000 mg | ORAL_TABLET | Freq: Once | ORAL | Status: DC

## 2014-12-26 MED ORDER — HYDROCODONE-HOMATROPINE 5-1.5 MG/5ML PO SYRP
5.0000 mL | ORAL_SOLUTION | ORAL | Status: DC | PRN
  Filled 2014-12-26: qty 5

## 2014-12-26 NOTE — ED Provider Notes (Signed)
CSN: [id number]     Arrival date & time 12/26/14  1733 History  This chart was scribed for Nelva Nayobert Rodgers Likes, MD by Tonye RoyaltyJoshua Chen, ED Scribe. This patient was seen in room MH12/MH12 and the patient's care was started at 6:28 PM.    Chief Complaint  Patient presents with  . Back Pain   The history is provided by the patient. No language interpreter was used.    HPI Comments: Corey Dunn is a 34 y.o. male who presents to the Emergency Department complaining of low back pain with onset at 1100 this morning, waking him from sleep. He states he did not have any pain before going to sleep last night and states he went to sleep and woke in a normal position. He has history of scoliosis and states he occasionally has back pain, but states this does not feel similar. He states pain is worse with walking or other movement. He states he has not used any medication for it. He also complains of cough and cold symptoms. Cough produced yellow phlegm this morning but has become clear. He also states his urine is dark. He used Dayquil this morning. He states he is prescribed Lisinopril 5mg  once a day, but states he has not used it in 1 year. He denies history of kidney problems. He uses cigarettes.   Past Medical History  Diagnosis Date  . Hypertension    History reviewed. No pertinent past surgical history. No family history on file. History  Substance Use Topics  . Smoking status: Current Every Day Smoker -- 1.00 packs/day    Types: Cigarettes  . Smokeless tobacco: Never Used  . Alcohol Use: Yes    Review of Systems  HENT: Positive for congestion.   Respiratory: Positive for cough.   Musculoskeletal: Positive for back pain.  All other systems reviewed and are negative.     Allergies  Amoxicillin; Ampicillin; and Penicillins  Home Medications   Prior to Admission medications   Medication Sig Start Date End Date Taking? Authorizing Provider  ibuprofen (ADVIL,MOTRIN) 600 MG tablet Take 1 tablet  (600 mg total) by mouth every 6 (six) hours as needed. 04/01/14   Derwood KaplanAnkit Nanavati, MD  lisinopril (PRINIVIL,ZESTRIL) 5 MG tablet Take 1 tablet (5 mg total) by mouth daily. 12/26/14   Nelva Nayobert Zidan Helget, MD   BP 156/99 mmHg  Pulse 92  Temp(Src) 100.2 F (37.9 C) (Oral)  Resp 18  Ht 5\' 5"  (1.651 m)  Wt 150 lb (68.04 kg)  BMI 24.96 kg/m2  SpO2 98% Physical Exam Physical Exam  Nursing note and vitals reviewed. Constitutional: He is oriented to person, place, and time. He appears well-developed and well-nourished. No distress.  HENT:  Head: Normocephalic and atraumatic.  Eyes: Pupils are equal, round, and reactive to light.  Neck: Normal range of motion.  Cardiovascular: Normal rate and intact distal pulses.   Pulmonary/Chest: No respiratory distress.  Abdominal: Normal appearance. He exhibits no distension.  no abdominal tenderness.  No rebound or guarding tenderness. Musculoskeletal: No CVA tenderness. Neurological: He is alert and oriented to person, place, and time. No cranial nerve deficit.  Skin: Skin is warm and dry. No rash noted.  Psychiatric: He has a normal mood and affect. His behavior is normal.   ED Course  Procedures (including critical care time)  DIAGNOSTIC STUDIES: Oxygen Saturation is 99% on room air, normal by my interpretation.    COORDINATION OF CARE: 6:33 PM Discussed treatment plan with patient at beside, including chest x-ray and UA.  the patient agrees with the plan and has no further questions at this time.   Labs Review Labs Reviewed  URINALYSIS, ROUTINE W REFLEX MICROSCOPIC - Abnormal; Notable for the following:    Specific Gravity, Urine 1.031 (*)    Ketones, ur 15 (*)    All other components within normal limits  BASIC METABOLIC PANEL - Abnormal; Notable for the following:    GFR calc non Af Amer 72 (*)    GFR calc Af Amer 83 (*)    Anion gap 2 (*)    All other components within normal limits  CBC WITH DIFFERENTIAL/PLATELET - Abnormal; Notable for the  following:    Monocytes Relative 13 (*)    All other components within normal limits    Imaging Review Dg Chest 2 View  12/26/2014   CLINICAL DATA:  Cough and fever for 3 days.  EXAM: CHEST  2 VIEW  COMPARISON:  08/01/2013  FINDINGS: The heart size and mediastinal contours are within normal limits. Both lungs are clear. No evidence of pleural effusion. No mass or lymphadenopathy identified. Moderate thoracic dextroscoliosis again noted.  IMPRESSION: No active cardiopulmonary disease.  Thoracic dextroscoliosis.   Electronically Signed   By: Myles Rosenthal M.D.   On: 12/26/2014 18:49      MDM   Final diagnoses:  Cough    I personally performed the services described in this documentation, which was scribed in my presence. The recorded information has been reviewed and considered.   Nelva Nay, MD 12/26/14 2117

## 2014-12-26 NOTE — ED Notes (Signed)
Pt c/o bilat lower back pain that started earlier today. Pt denies any heavy lifting. Pt sts he also has cold and cough.

## 2014-12-26 NOTE — ED Notes (Signed)
MD at bedside. 

## 2014-12-26 NOTE — ED Notes (Signed)
Pt alert, NAD, calm, interactive, resps e/u, speaking in clear complete sentences. Family at BS.  

## 2014-12-26 NOTE — Discharge Instructions (Signed)
Cough, Adult ° A cough is a reflex that helps clear your throat and airways. It can help heal the body or may be a reaction to an irritated airway. A cough may only last 2 or 3 weeks (acute) or may last more than 8 weeks (chronic).  °CAUSES °Acute cough: °· Viral or bacterial infections. °Chronic cough: °· Infections. °· Allergies. °· Asthma. °· Post-nasal drip. °· Smoking. °· Heartburn or acid reflux. °· Some medicines. °· Chronic lung problems (COPD). °· Cancer. °SYMPTOMS  °· Cough. °· Fever. °· Chest pain. °· Increased breathing rate. °· High-pitched whistling sound when breathing (wheezing). °· Colored mucus that you cough up (sputum). °TREATMENT  °· A bacterial cough may be treated with antibiotic medicine. °· A viral cough must run its course and will not respond to antibiotics. °· Your caregiver may recommend other treatments if you have a chronic cough. °HOME CARE INSTRUCTIONS  °· Only take over-the-counter or prescription medicines for pain, discomfort, or fever as directed by your caregiver. Use cough suppressants only as directed by your caregiver. °· Use a cold steam vaporizer or humidifier in your bedroom or home to help loosen secretions. °· Sleep in a semi-upright position if your cough is worse at night. °· Rest as needed. °· Stop smoking if you smoke. °SEEK IMMEDIATE MEDICAL CARE IF:  °· You have pus in your sputum. °· Your cough starts to worsen. °· You cannot control your cough with suppressants and are losing sleep. °· You begin coughing up blood. °· You have difficulty breathing. °· You develop pain which is getting worse or is uncontrolled with medicine. °· You have a fever. °MAKE SURE YOU:  °· Understand these instructions. °· Will watch your condition. °· Will get help right away if you are not doing well or get worse. °Document Released: 04/05/2011 Document Revised: 12/30/2011 Document Reviewed: 04/05/2011 °ExitCare® Patient Information ©2015 [employer]. This information is not intended  to replace advice given to you by your health care provider. Make sure you discuss any questions you have with your health care provider. ° °Fever, Adult °A fever is a higher than normal body temperature. In an adult, an oral temperature around 98.6° F (37° C) is considered normal. A temperature of 100.4° F (38° C) or higher is generally considered a fever. Mild or moderate fevers generally have no long-term effects and often do not require treatment. Extreme fever (greater than or equal to 106° F or 41.1° C) can cause seizures. The sweating that may occur with repeated or prolonged fever may cause dehydration. Elderly people can develop confusion during a fever. °A measured temperature can vary with: °· Age. °· Time of day. °· Method of measurement (mouth, underarm, rectal, or ear). °The fever is confirmed by taking a temperature with a thermometer. Temperatures can be taken different ways. Some methods are accurate and some are not. °· An oral temperature is used most commonly. Electronic thermometers are fast and accurate. °· An ear temperature will only be accurate if the thermometer is positioned as recommended by the manufacturer. °· A rectal temperature is accurate and done for those adults who have a condition where an oral temperature cannot be taken. °· An underarm (axillary) temperature is not accurate and not recommended. °Fever is a symptom, not a disease.  °CAUSES  °· Infections commonly cause fever. °· Some noninfectious causes for fever include: °¨ Some arthritis conditions. °¨ Some thyroid or adrenal gland conditions. °¨ Some immune system conditions. °¨ Some types of cancer. °¨   A medicine reaction. °¨ High doses of certain street drugs such as methamphetamine. °¨ Dehydration. °¨ Exposure to high outside or room temperatures. °· Occasionally, the source of a fever cannot be determined. This is sometimes called a "fever of unknown origin" (FUO). °· Some situations may lead to a temporary rise in body  temperature that may go away on its own. Examples are: °¨ Childbirth. °¨ Surgery. °¨ Intense exercise. °HOME CARE INSTRUCTIONS  °· Take appropriate medicines for fever. Follow dosing instructions carefully. If you use acetaminophen to reduce the fever, be careful to avoid taking other medicines that also contain acetaminophen. Do not take aspirin for a fever if you are younger than age 19. There is an association with Reye's syndrome. Reye's syndrome is a rare but potentially deadly disease. °· If an infection is present and antibiotics have been prescribed, take them as directed. Finish them even if you start to feel better. °· Rest as needed. °· Maintain an adequate fluid intake. To prevent dehydration during an illness with prolonged or recurrent fever, you may need to drink extra fluid. Drink enough fluids to keep your urine clear or pale yellow. °· Sponging or bathing with room temperature water may help reduce body temperature. Do not use ice water or alcohol sponge baths. °· Dress comfortably, but do not over-bundle. °SEEK MEDICAL CARE IF:  °· You are unable to keep fluids down. °· You develop vomiting or diarrhea. °· You are not feeling at least partly better after 3 days. °· You develop new symptoms or problems. °SEEK IMMEDIATE MEDICAL CARE IF:  °· You have shortness of breath or trouble breathing. °· You develop excessive weakness. °· You are dizzy or you faint. °· You are extremely thirsty or you are making little or no urine. °· You develop new pain that was not there before (such as in the head, neck, chest, back, or abdomen). °· You have persistent vomiting and diarrhea for more than 1 to 2 days. °· You develop a stiff neck or your eyes become sensitive to light. °· You develop a skin rash. °· You have a fever or persistent symptoms for more than 2 to 3 days. °· You have a fever and your symptoms suddenly get worse. °MAKE SURE YOU:  °· Understand these instructions. °· Will watch your  condition. °· Will get help right away if you are not doing well or get worse. °Document Released: 04/02/2001 Document Revised: 02/21/2014 Document Reviewed: 08/08/2011 °ExitCare® Patient Information ©2015 [employer]. This information is not intended to replace advice given to you by your health care provider. Make sure you discuss any questions you have with your health care provider. ° °

## 2014-12-26 NOTE — ED Notes (Signed)
Pt returned from xray

## 2015-01-10 ENCOUNTER — Encounter (HOSPITAL_BASED_OUTPATIENT_CLINIC_OR_DEPARTMENT_OTHER): Payer: Self-pay | Admitting: Emergency Medicine

## 2015-01-10 ENCOUNTER — Emergency Department (HOSPITAL_BASED_OUTPATIENT_CLINIC_OR_DEPARTMENT_OTHER)
Admission: EM | Admit: 2015-01-10 | Discharge: 2015-01-10 | Disposition: A | Discharge status: Home / Self Care | Payer: Self-pay | Attending: Emergency Medicine | Admitting: Emergency Medicine

## 2015-01-10 ENCOUNTER — Emergency Department (HOSPITAL_BASED_OUTPATIENT_CLINIC_OR_DEPARTMENT_OTHER): Payer: Self-pay

## 2015-01-10 DIAGNOSIS — Y998 Other external cause status: Secondary | ICD-10-CM | POA: Insufficient documentation

## 2015-01-10 DIAGNOSIS — Z880 Allergy status to penicillin: Secondary | ICD-10-CM | POA: Insufficient documentation

## 2015-01-10 DIAGNOSIS — I10 Essential (primary) hypertension: Secondary | ICD-10-CM | POA: Insufficient documentation

## 2015-01-10 DIAGNOSIS — Z720 Tobacco use: Secondary | ICD-10-CM | POA: Insufficient documentation

## 2015-01-10 DIAGNOSIS — Y9367 Activity, basketball: Secondary | ICD-10-CM | POA: Insufficient documentation

## 2015-01-10 DIAGNOSIS — Z79899 Other long term (current) drug therapy: Secondary | ICD-10-CM | POA: Insufficient documentation

## 2015-01-10 DIAGNOSIS — M419 Scoliosis, unspecified: Secondary | ICD-10-CM | POA: Insufficient documentation

## 2015-01-10 DIAGNOSIS — Y9239 Other specified sports and athletic area as the place of occurrence of the external cause: Secondary | ICD-10-CM | POA: Insufficient documentation

## 2015-01-10 DIAGNOSIS — M25552 Pain in left hip: Secondary | ICD-10-CM

## 2015-01-10 DIAGNOSIS — S73102A Unspecified sprain of left hip, initial encounter: Secondary | ICD-10-CM | POA: Insufficient documentation

## 2015-01-10 DIAGNOSIS — Z792 Long term (current) use of antibiotics: Secondary | ICD-10-CM | POA: Insufficient documentation

## 2015-01-10 DIAGNOSIS — W500XXA Accidental hit or strike by another person, initial encounter: Secondary | ICD-10-CM | POA: Insufficient documentation

## 2015-01-10 HISTORY — DX: Scoliosis, unspecified: M41.9

## 2015-01-10 MED ORDER — OXYCODONE-ACETAMINOPHEN 5-325 MG PO TABS: 1.0000 | ORAL_TABLET | ORAL | Status: DC | PRN

## 2015-01-10 MED ORDER — KETOROLAC TROMETHAMINE 60 MG/2ML IM SOLN
30.0000 mg | Freq: Once | INTRAMUSCULAR | Status: AC
  Administered 2015-01-10: 30 mg via INTRAMUSCULAR
  Filled 2015-01-10: qty 2

## 2015-01-10 MED ORDER — CYCLOBENZAPRINE HCL 10 MG PO TABS: 10.0000 mg | ORAL_TABLET | Freq: Two times a day (BID) | ORAL | Status: DC | PRN

## 2015-01-10 NOTE — ED Notes (Signed)
MD at bedside. 

## 2015-01-10 NOTE — ED Provider Notes (Signed)
CSN: [id number]     Arrival date & time 01/10/15  1753 History  This chart was scribed for Mirian Mo, MD by Elon Spanner, ED Scribe. This patient was seen in room MH06/MH06 and the patient's care was started at 6:01 PM.   Chief Complaint  Patient presents with  . Groin Pain   The history is provided by the patient. No language interpreter was used.   HPI Comments: Corey Dunn is a 34 y.o. male who presents to the Emergency Department complaining of constant sharp left groin pain onset earlier today.  The patient reports he was in gym class playing basketball and jumped up for a rebound and began to feel his current complaint upon landing.  He states that movement and laying on his back aggravate the pain.  Patient is able to ambulate with pain.  Patient reports a history of scoliosis and HTN but denies history of back injuries or other history.  Patient denies numbness, weakness, abdominal pain.   Past Medical History  Diagnosis Date  . Hypertension   . Scoliosis    History reviewed. No pertinent past surgical history. No family history on file. History  Substance Use Topics  . Smoking status: Current Every Day Smoker -- 1.00 packs/day    Types: Cigarettes  . Smokeless tobacco: Never Used  . Alcohol Use: Yes    Review of Systems  Gastrointestinal: Negative for abdominal pain.  Musculoskeletal: Positive for myalgias.  Neurological: Negative for weakness and numbness.  All other systems reviewed and are negative.     Allergies  Amoxicillin; Ampicillin; and Penicillins  Home Medications   Prior to Admission medications   Medication Sig Start Date End Date Taking? Authorizing Provider  cyclobenzaprine (FLEXERIL) 10 MG tablet Take 1 tablet (10 mg total) by mouth 2 (two) times daily as needed for muscle spasms. 01/10/15   Mirian Mo, MD  doxycycline (VIBRA-TABS) 100 MG tablet Take 1 tablet (100 mg total) by mouth 2 (two) times daily. 12/26/14   Nelva Nay, MD   HYDROcodone-homatropine [facility]) 5-1.5 MG/5ML syrup Take 5 mLs by mouth every 4 (four) hours as needed for cough. 12/26/14   Nelva Nay, MD  ibuprofen (ADVIL,MOTRIN) 600 MG tablet Take 1 tablet (600 mg total) by mouth every 6 (six) hours as needed. 04/01/14   Derwood Kaplan, MD  lisinopril (PRINIVIL,ZESTRIL) 5 MG tablet Take 1 tablet (5 mg total) by mouth daily. 12/26/14   Nelva Nay, MD  oxyCODONE-acetaminophen (PERCOCET/ROXICET) 5-325 MG per tablet Take 1-2 tablets by mouth every 4 (four) hours as needed. 01/10/15   Mirian Mo, MD   BP 141/82 mmHg  Pulse 71  Temp(Src) 98.1 F (36.7 C) (Oral)  Resp 16  Ht  (1.651 m)  Wt 155 lb (70.308 kg)  BMI 25.79 kg/m2  SpO2 99% Physical Exam  Constitutional: He is oriented to person, place, and time. He appears well-developed and well-nourished.  HENT:  Head: Normocephalic and atraumatic.  Eyes: Conjunctivae and EOM are normal.  Neck: Normal range of motion. Neck supple.  Cardiovascular: Normal rate, regular rhythm and normal heart sounds.   Pulmonary/Chest: Effort normal and breath sounds normal. No respiratory distress.  Abdominal: He exhibits no distension. There is no tenderness. There is no rebound and no guarding.  Musculoskeletal: Normal range of motion.       Left hip: He exhibits tenderness and bony tenderness. He exhibits normal range of motion, normal strength and no swelling.       Left knee: Normal.  Left ankle: Normal.       Cervical back: Normal.       Thoracic back: Normal.       Lumbar back: Normal.       Left upper leg: Normal.  Neurological: He is alert and oriented to person, place, and time.  Skin: Skin is warm and dry.  Vitals reviewed.   ED Course  Procedures (including critical care time)  DIAGNOSTIC STUDIES: Oxygen Saturation is 100% on RA, normal by my interpretation.    COORDINATION OF CARE:  6:07 PM Discussed treatment plan with patient at bedside.  Patient acknowledges and agrees with  plan.    Labs Review Labs Reviewed - No data to display  Imaging Review Dg Hip Unilat With Pelvis 2-3 Views Left  01/10/2015   CLINICAL DATA:  Left groin pain after a basketball injury today, landing on the left hip.  EXAM: LEFT HIP (WITH PELVIS) 2-3 VIEWS  COMPARISON:  None.  FINDINGS: The patient appears to be wearing clothing with buttons, casting shadows over the lower pelvis.  I do not see a fracture or hip dislocation. The no acute bony findings.  IMPRESSION: 1. No acute bony findings. If the patient is unable to bear weight or if otherwise clinically warranted, CT or MRI could be utilized for further workup.   Electronically Signed   By: Gaylyn RongWalter  Liebkemann M.D.   On: 01/10/2015 19:08     EKG Interpretation None      MDM   Final diagnoses:  Left hip pain  Hip sprain, left, initial encounter    34 y.o. male with pertinent PMH of scoliosis presents with left hip pain as above. On arrival the patient has vital signs physical exam as above. Left hip exam as above with intact range of motion, some tenderness. X-ray obtained and unremarkable. Likely strain. Doubt occult fracture given patient's ability to ambulate and mechanism of injury. He was given standard return precautions for strain and sprain. Discharged home in stable condition with standard return precautions.    I have reviewed all laboratory and imaging studies if ordered as above  1. Hip sprain, left, initial encounter   2. Left hip pain           Mirian MoMatthew Gentry, MD 01/10/15 [phone]

## 2015-01-10 NOTE — Discharge Instructions (Signed)
Arthralgia °Your caregiver has diagnosed you as suffering from an arthralgia. Arthralgia means there is pain in a joint. This can come from many reasons including: °· Bruising the joint which causes soreness (inflammation) in the joint. °· Wear and tear on the joints which occur as we grow older (osteoarthritis). °· Overusing the joint. °· Various forms of arthritis. °· Infections of the joint. °Regardless of the cause of pain in your joint, most of these different pains respond to anti-inflammatory drugs and rest. The exception to this is when a joint is infected, and these cases are treated with antibiotics, if it is a bacterial infection. °HOME CARE INSTRUCTIONS  °· Rest the injured area for as long as directed by your caregiver. Then slowly start using the joint as directed by your caregiver and as the pain allows. Crutches as directed may be useful if the ankles, knees or hips are involved. If the knee was splinted or casted, continue use and care as directed. If an stretchy or elastic wrapping bandage has been applied today, it should be removed and re-applied every 3 to 4 hours. It should not be applied tightly, but firmly enough to keep swelling down. Watch toes and feet for swelling, bluish discoloration, coldness, numbness or excessive pain. If any of these problems (symptoms) occur, remove the ace bandage and re-apply more loosely. If these symptoms persist, contact your caregiver or return to this location. °· For the first 24 hours, keep the injured extremity elevated on pillows while lying down. °· Apply ice for 15-20 minutes to the sore joint every couple hours while awake for the first half day. Then 03-04 times per day for the first 48 hours. Put the ice in a plastic bag and place a towel between the bag of ice and your skin. °· Wear any splinting, casting, elastic bandage applications, or slings as instructed. °· Only take over-the-counter or prescription medicines for pain, discomfort, or fever as  directed by your caregiver. Do not use aspirin immediately after the injury unless instructed by your physician. Aspirin can cause increased bleeding and bruising of the tissues. °· If you were given crutches, continue to use them as instructed and do not resume weight bearing on the sore joint until instructed. °Persistent pain and inability to use the sore joint as directed for more than 2 to 3 days are warning signs indicating that you should see a caregiver for a follow-up visit as soon as possible. Initially, a hairline fracture (break in bone) may not be evident on X-rays. Persistent pain and swelling indicate that further evaluation, non-weight bearing or use of the joint (use of crutches or slings as instructed), or further X-rays are indicated. X-rays may sometimes not show a small fracture until a week or 10 days later. Make a follow-up appointment with your own caregiver or one to whom we have referred you. A radiologist (specialist in reading X-rays) may read your X-rays. Make sure you know how you are to obtain your X-ray results. Do not assume everything is normal if you do not hear from us. °SEEK MEDICAL CARE IF: °Bruising, swelling, or pain increases. °SEEK IMMEDIATE MEDICAL CARE IF:  °· Your fingers or toes are numb or blue. °· The pain is not responding to medications and continues to stay the same or get worse. °· The pain in your joint becomes severe. °· You develop a fever over 102° F (38.9° C). °· It becomes impossible to move or use the joint. °MAKE SURE YOU:  °·   Understand these instructions.  Will watch your condition.  Will get help right away if you are not doing well or get worse. Document Released: 10/07/2005 Document Revised: 12/30/2011 Document Reviewed: 05/25/2008 [facility] Patient Information 2015 [place]Care, [place]. This information is not intended to replace advice given to you by your health care provider. Make sure you discuss any questions you have with your health care  provider. Sprain A sprain is a tear in one of the strong, fibrous tissues that connect your bones (ligaments). The severity of the sprain depends on how much of the ligament is torn. The tear can be either partial or complete. CAUSES  Often, sprains are a result of a fall or an injury. The force of the impact causes the fibers of your ligament to stretch beyond their normal length. This excess tension causes the fibers of your ligament to tear. SYMPTOMS  You may have some loss of motion or increased pain within your normal range of motion. Other symptoms include:  Bruising.  Tenderness.  Swelling. DIAGNOSIS  In order to diagnose a sprain, your caregiver will physically examine you to determine how torn the ligament is. Your caregiver may also suggest an X-ray exam to make sure no bones are broken. TREATMENT  If your ligament is only partially torn, treatment usually involves keeping the injured area in a fixed position (immobilization) for a short period. To do this, your caregiver will apply a bandage, cast, or splint to keep the area from moving until it heals. For a partially torn ligament, the healing process usually takes 2 to 3 weeks. If your ligament is completely torn, you may need surgery to reconnect the ligament to the bone or to reconstruct the ligament. After surgery, a cast or splint may be applied and will need to stay on for 4 to 6 weeks while your ligament heals. HOME CARE INSTRUCTIONS  Keep the injured area elevated to decrease swelling.  To ease pain and swelling, apply ice to your joint twice a day, for 2 to 3 days.  Put ice in a plastic bag.  Place a towel between your skin and the bag.  Leave the ice on for 15 minutes.  Only take over-the-counter or prescription medicine for pain as directed by your caregiver.  Do not leave the injured area unprotected until pain and stiffness go away (usually 3 to 4 weeks).  Do not allow your cast or splint to get wet. Cover  your cast or splint with a plastic bag when you shower or bathe. Do not swim.  Your caregiver may suggest exercises for you to do during your recovery to prevent or limit permanent stiffness. SEEK IMMEDIATE MEDICAL CARE IF:  Your cast or splint becomes damaged.  Your pain becomes worse. MAKE SURE YOU:  Understand these instructions.  Will watch your condition.  Will get help right away if you are not doing well or get worse. Document Released: 10/04/2000 Document Revised: 12/30/2011 Document Reviewed: 10/19/2011 [facility] Patient Information 2015 [place]ExitCare, [place]LL[place]. This information is not intended to replace advice given to you by your health care provider. Make sure you discuss any questions you have with your health care provider.

## 2015-01-10 NOTE — ED Notes (Signed)
Pt playing basketball and impacted another player.  Pain in left hip, groin, and testicle.

## 2015-06-15 ENCOUNTER — Encounter (HOSPITAL_BASED_OUTPATIENT_CLINIC_OR_DEPARTMENT_OTHER): Payer: Self-pay | Admitting: *Deleted

## 2015-06-15 ENCOUNTER — Emergency Department (HOSPITAL_BASED_OUTPATIENT_CLINIC_OR_DEPARTMENT_OTHER)
Admission: EM | Admit: 2015-06-15 | Discharge: 2015-06-15 | Disposition: A | Discharge status: Home / Self Care | Payer: Self-pay | Attending: Emergency Medicine | Admitting: Emergency Medicine

## 2015-06-15 DIAGNOSIS — M419 Scoliosis, unspecified: Secondary | ICD-10-CM | POA: Insufficient documentation

## 2015-06-15 DIAGNOSIS — Z79899 Other long term (current) drug therapy: Secondary | ICD-10-CM | POA: Insufficient documentation

## 2015-06-15 DIAGNOSIS — Z720 Tobacco use: Secondary | ICD-10-CM | POA: Insufficient documentation

## 2015-06-15 DIAGNOSIS — Z792 Long term (current) use of antibiotics: Secondary | ICD-10-CM | POA: Insufficient documentation

## 2015-06-15 DIAGNOSIS — Z880 Allergy status to penicillin: Secondary | ICD-10-CM | POA: Insufficient documentation

## 2015-06-15 DIAGNOSIS — K644 Residual hemorrhoidal skin tags: Secondary | ICD-10-CM | POA: Insufficient documentation

## 2015-06-15 DIAGNOSIS — IMO0001 Reserved for inherently not codable concepts without codable children: Secondary | ICD-10-CM

## 2015-06-15 DIAGNOSIS — I10 Essential (primary) hypertension: Secondary | ICD-10-CM | POA: Insufficient documentation

## 2015-06-15 DIAGNOSIS — R030 Elevated blood-pressure reading, without diagnosis of hypertension: Secondary | ICD-10-CM

## 2015-06-15 MED ORDER — LIDOCAINE HCL 2 % EX GEL
1.0000 | Freq: Once | CUTANEOUS | Status: AC
Start: 2015-06-15 — End: 2015-06-15
  Administered 2015-06-15: 1
  Filled 2015-06-15: qty 20

## 2015-06-15 MED ORDER — SENNOSIDES-DOCUSATE SODIUM 8.6-50 MG PO TABS: 1.0000 | ORAL_TABLET | Freq: Every day | ORAL | Status: DC

## 2015-06-15 MED ORDER — LIDOCAINE VISCOUS 2 % MT SOLN: OROMUCOSAL | Status: DC

## 2015-06-15 NOTE — Discharge Instructions (Signed)
Please follow with your primary care doctor in the next 5 days for high blood pressure evaluation. If you do not have a primary care doctor, present to urgent care. Reduce salt intake. Seek emergency medical care for unilateral weakness, slurring, change in vision, or chest pain and shortness of breath.  Do not hesitate to return to the emergency room for any new, worsening or concerning symptoms.  Please obtain primary care using resource guide below. Let them know that you were seen in the emergency room and that they will need to obtain records for further outpatient management.   Hemorrhoids Hemorrhoids are swollen veins around the rectum or anus. There are two types of hemorrhoids:   Internal hemorrhoids. These occur in the veins just inside the rectum. They may poke through to the outside and become irritated and painful.  External hemorrhoids. These occur in the veins outside the anus and can be felt as a painful swelling or hard lump near the anus. CAUSES  Pregnancy.   Obesity.   Constipation or diarrhea.   Straining to have a bowel movement.   Sitting for long periods on the toilet.  Heavy lifting or other activity that caused you to strain.  Anal intercourse. SYMPTOMS   Pain.   Anal itching or irritation.   Rectal bleeding.   Fecal leakage.   Anal swelling.   One or more lumps around the anus.  DIAGNOSIS  Your caregiver may be able to diagnose hemorrhoids by visual examination. Other examinations or tests that may be performed include:   Examination of the rectal area with a gloved hand (digital rectal exam).   Examination of anal canal using a small tube (scope).   A blood test if you have lost a significant amount of blood.  A test to look inside the colon (sigmoidoscopy or colonoscopy). TREATMENT Most hemorrhoids can be treated at home. However, if symptoms do not seem to be getting better or if you have a lot of rectal bleeding, your  caregiver may perform a procedure to help make the hemorrhoids get smaller or remove them completely. Possible treatments include:   Placing a rubber band at the base of the hemorrhoid to cut off the circulation (rubber band ligation).   Injecting a chemical to shrink the hemorrhoid (sclerotherapy).   Using a tool to burn the hemorrhoid (infrared light therapy).   Surgically removing the hemorrhoid (hemorrhoidectomy).   Stapling the hemorrhoid to block blood flow to the tissue (hemorrhoid stapling).  HOME CARE INSTRUCTIONS   Eat foods with fiber, such as whole grains, beans, nuts, fruits, and vegetables. Ask your doctor about taking products with added fiber in them (fibersupplements).  Increase fluid intake. Drink enough water and fluids to keep your urine clear or pale yellow.   Exercise regularly.   Go to the bathroom when you have the urge to have a bowel movement. Do not wait.   Avoid straining to have bowel movements.   Keep the anal area dry and clean. Use wet toilet paper or moist towelettes after a bowel movement.   Medicated creams and suppositories may be used or applied as directed.   Only take over-the-counter or prescription medicines as directed by your caregiver.   Take warm sitz baths for 15-20 minutes, 3-4 times a day to ease pain and discomfort.   Place ice packs on the hemorrhoids if they are tender and swollen. Using ice packs between sitz baths may be helpful.   Put ice in a plastic bag.  Place a towel between your skin and the bag.   Leave the ice on for 15-20 minutes, 3-4 times a day.   Do not use a donut-shaped pillow or sit on the toilet for long periods. This increases blood pooling and pain.  SEEK MEDICAL CARE IF:  You have increasing pain and swelling that is not controlled by treatment or medicine.  You have uncontrolled bleeding.  You have difficulty or you are unable to have a bowel movement.  You have pain or  inflammation outside the area of the hemorrhoids. MAKE SURE YOU:  Understand these instructions.  Will watch your condition.  Will get help right away if you are not doing well or get worse. Document Released: 10/04/2000 Document Revised: 09/23/2012 Document Reviewed: 08/11/2012 [facility] Patient Information 2015 [place], [place]. This information is not intended to replace advice given to you by your health care provider. Make sure you discuss any questions you have with your health care provider.   Emergency Department Resource Guide 1) Find a Doctor and Pay Out of Pocket Although you won't have to find out who is covered by your insurance plan, it is a good idea to ask around and get recommendations. You will then need to call the office and see if the doctor you have chosen will accept you as a new patient and what types of options they offer for patients who are self-pay. Some doctors offer discounts or will set up payment plans for their patients who do not have insurance, but you will need to ask so you aren't surprised when you get to your appointment.  2) Contact Your Local Health Department Not all health departments have doctors that can see patients for sick visits, but many do, so it is worth a call to see if yours does. If you don't know where your local health department is, you can check in your phone book. The CDC also has a tool to help you locate your state's health department, and many state websites also have listings of all of their local health departments.  3) Find a Walk-in Clinic If your illness is not likely to be very severe or complicated, you may want to try a walk in clinic. These are popping up all over the country in pharmacies, drugstores, and shopping centers. They're usually staffed by nurse practitioners or physician assistants that have been trained to treat common illnesses and complaints. They're usually fairly quick and inexpensive. However, if you have  serious medical issues or chronic medical problems, these are probably not your best option.  No Primary Care Doctor: - Call Health Connect at  [phone] - they can help you locate a primary care doctor that  accepts your insurance, provides certain services, etc. - Physician Referral Service- [phone]  Chronic Pain Problems: Organization         Address  Phone   Notes  [facility]  [phone] Patients need to be referred by their primary care doctor.   Medication Assistance: Organization         Address  Phone   Notes  [facility] Medication [facility] [address] [place] [place], [place] [address] [phone] --Must be a resident of [facility] -- Must have NO insurance coverage whatsoever (no Medicaid/ Medicare, etc.) -- The pt. MUST have a primary care doctor that directs their care regularly and follows them in the community   [facility]  [phone]   [address]  [phone]  Agencies that provide inexpensive medical care: Organization         Address  Phone   Notes  [facility]  [phone]   [facility]    [phone]   [facility] [address] [place], [place] [address] [phone]   [facility] [address], [place] [phone]   [facility]anned Parenthood    [phone]   [facility]    [phone]   [facility] and [facility]  [address], [place] Phone:  [phone], Fax:  [phone] Hours of Operation:  9 am - 6 pm, M-F.  Also accepts Medicaid/Medicare and self-pay.  [facility]  [address], [place] Phone: [phone], Fax: [phone]. Hours of Operation:  8:30 am - 5:30 pm, M-F.  Also accepts Medicaid and self-pay.  [facility] [place] [address] [place] Phone: [phone]   [facility] [address] Natasha Bence [place], [place] [phone], Ext. [phone] Mondays & Thursdays: 7-9 AM.  First 15 patients are seen on a first come, first serve basis.    Medicaid-accepting [employer] Providers:  Organization         Address  Phone   Notes  [facility] [address], [place] [phone] Also accepts self-pay patients.  [facility] [address] Laurell Josephs [place], [place]  [phone]   [facility] [address], [place] [phone]   [facility] [address] [place] [phone]   Renaye Rakers [address], [place]   [phone] Only accepts [place] Access [place] patients after they have their name applied to their card.   Self-Pay (no insurance) in [facility]:  Organization         Address  Phone   Notes  Sickle Cell Patients, [facility] [address] [place], [place] [phone]   [facility] [address] [place], [place] [phone]   [facility] [place]  [address], [place] [phone]   [facility]/Dr. Osei-Bonsu  [address], [place] or [address], [place] [phone] Phone number for both [place] and [place] locations is the same.  [facility] [address], [place] [phone]   [facility] [address] [place] or [address] [phone]   [facility] [address] [place] [phone], phone; [phone], fax Sees patients 1st and 3rd Saturday of every month.  Must not qualify for public or private insurance (i.e. Medicaid, Medicare, [place] Health Choice, Veterans' Benefits)  Household income should be no more than 200% of the poverty level The clinic cannot treat you if you are pregnant or think you are pregnant   Sexually transmitted diseases are not treated at the clinic.    Dental Care: Organization         Address  Phone  Notes  [facility] Department of [facility] [address] [place], [place] [phone] Accepts children up to age 93 who are enrolled in [place] or [place] Health Choice; pregnant women with a Medicaid card; and children who have applied for Medicaid or  [place] Health Choice, but were declined, whose parents can pay a reduced fee at time of service.  [facility] Department of [employer]  [address], [place] [phone] Accepts children up to age 9 who are enrolled in [place] or [place] Health Choice; pregnant women with a Medicaid card; and children who have applied for Medicaid or [place] Health Choice, but were declined, whose parents can pay a reduced fee at time of service.  [facility]  [address] [place], [place] [phone] Patients are seen by appointment only. Walk-ins are not accepted. [facility] will see patients 64 years of age and older. Monday - Tuesday (8am-5pm) Most Wednesdays (8:30-5pm) $30 per visit, cash only  [facility] Adult Dental Access PROGRAM  [address], [facility] [phone] Patients are seen by appointment only. Walk-ins are not accepted. [facility] will see patients 61 years of age and older. One Wednesday Evening (Monthly: Volunteer Based).  $30 per visit, cash only  [employer]  [phone] for adults; Children under age 19, call Graduate Pediatric Dentistry at [phone]. Children aged 45-14, please call [phone] to request a pediatric application.  Dental services are provided in all areas of dental care including fillings, crowns and bridges, complete and partial dentures, implants, gum treatment, root canals, and extractions. Preventive care is also provided. Treatment is provided to both adults and children. Patients  are selected via a lottery and there is often a waiting list.   [facility] [address] [place]  [phone] [email or web address]   [facility] [address] [place], [place] [phone], Ext. [phone] Second and Fourth Thursday of each month, opens at 6:30 AM; Clinic ends at 9 AM.  Patients are seen on a first-come first-served basis, and a limited number are seen during each clinic.   [facility]  [address] Ether Griffins [place], [place] [phone]   Eligibility Requirements You must have lived in [place], [place], or [place] counties for at least the last three months.   You cannot be eligible for state or federal sponsored [place], including [employer], [place], or [employer].   You generally cannot be eligible for healthcare insurance through your employer.    How to apply: Eligibility screenings are held every Tuesday and Wednesday afternoon from 1:00 pm until 4:00 pm. You do not need an appointment for the interview!  [facility] [address], [place], [place] [phone]   [facility] Health Department  [phone]   [facility] Health Department  [phone]   [facility]  [phone]    Behavioral Health Resources in the Community: Intensive Outpatient Programs Organization         Address  Phone  Notes  [facility] Services [address] [place], [place] [phone]   [facility] Outpatient [address] [place], [place] [phone]   [facility] [address], [place], [place]  [phone]   [facility] [address]  [place], [place] [phone] or [phone]   Substance Abuse Resources Organization         Address  Phone  Notes  [facility] Drug Services  [phone]   [facility]  [phone]   [place]  [phone]   Floydene Flock  [phone]    Residential & Outpatient Substance Abuse Program  [phone]  Psychological Services Organization         Address  Phone  Notes  [facility]  [phone]   [facility]  [phone]   [facility] Mental Health [phone] [address] [place] [phone] or [phone]    Mobile Crisis Teams Organization         Address  Phone  Notes  [facility], Mobile Crisis Care Unit  [phone]   [facility]  [address] [place], [place] [phone]   Doristine Locks [address] [place] [phone]    Self-Help/Support Groups Organization         Address  Phone             Notes  [facility] [place] - variety of support groups  [phone]- [phone] Call for more information  Narcotics Anonymous (NA), [facility] [address], [employer] [place]  2 meetings at this location   Statistician         Address  Phone  Notes  [facility] [address],    [place]  [phone]   [facility]  [address] [place] [address], [place], [place] [phone]   [facility] [address] [place], [place] [phone] Admissions: 8am-3pm M-F  [facility] [address],    [place], [place] [phone]   [facility] [address] [place], [place], [place] [phone]   [facility] [address],  [place], [place] [phone]   [facility] - Intensive Outpatient [address] Laurell Josephs [address], [place], [place] [phone]   [facility] ([facility] [address] [place].,  [place], [place] [phone] or [phone]   [facility] ([facility]) [address], [place], [place] [phone] Accepts Medicaid  [facility] [place] [address]  [place] [phone] Substance Abuse/Addiction Treatment   [facility] Organization         Address  Phone  Notes  [facility]  [phone]   Angie Fava, PhD [address] Ervin Knack [place], [place]   [phone] or [phone]   [facility]   [address] [place], [place] [phone]   [facility] [address], [place], [place] [phone] Insurance/Medicaid/sponsorship through [facility] [address]                                    [place], [place] [phone] Therapy/tele-psych/case  [facility] [address], [place] [phone]    Dr. Lolly Mustache  [phone]   [facility] [place]  [employer] [facility] Dept. 1) [address] [place] 2) [address] [place] 3)  [address], [place] [phone]  [phone]   [facility] Child Abuse Hotline (705)043-0706 or [phone] (After Hours)

## 2015-06-15 NOTE — ED Notes (Signed)
2 days ago he was lifting weights and felt pain in his anus and lower back. He has protrusion from his rectum. No bleeding.

## 2015-06-15 NOTE — ED Provider Notes (Signed)
CSN: [id number]     Arrival date & time 06/15/15  1944 History   First MD Initiated Contact with Patient 06/15/15 2018     Chief Complaint  Patient presents with  . Rectal Pain     (Consider location/radiation/quality/duration/timing/severity/associated sxs/prior Treatment) HPI  Blood pressure 169/98, pulse 86, temperature 98.4 F (36.9 C), temperature source Oral, resp. rate 20, height 5\' 5"  (1.651 m), weight 140 lb (63.504 kg), SpO2 100 %.  Corey Dunn is a 34 y.o. male complaining of painful swelling to the anus. Patient states that he was lifting weights, he had a pain in the shoulder and it shot down to the rectum x2 days ago. Patient states that he feels like he has to have a bowel movement but has not had one for 3 days, he had a nonbloody, loose stool this afternoon. He denies fevers, chills, nausea, vomiting, abdominal pain.  Past Medical History  Diagnosis Date  . Hypertension   . Scoliosis    History reviewed. No pertinent past surgical history. No family history on file. Social History  Substance Use Topics  . Smoking status: Current Every Day Smoker -- 1.00 packs/day    Types: Cigarettes  . Smokeless tobacco: Never Used  . Alcohol Use: Yes    Review of Systems  10 systems reviewed and found to be negative, except as noted in the HPI.   Allergies  Amoxicillin; Ampicillin; and Penicillins  Home Medications   Prior to Admission medications   Medication Sig Start Date End Date Taking? Authorizing Provider  cyclobenzaprine (FLEXERIL) 10 MG tablet Take 1 tablet (10 mg total) by mouth 2 (two) times daily as needed for muscle spasms. 01/10/15   Mirian Mo, MD  doxycycline (VIBRA-TABS) 100 MG tablet Take 1 tablet (100 mg total) by mouth 2 (two) times daily. 12/26/14   Nelva Nay, MD  HYDROcodone-homatropine [facility]) 5-1.5 MG/5ML syrup Take 5 mLs by mouth every 4 (four) hours as needed for cough. 12/26/14   Nelva Nay, MD  ibuprofen (ADVIL,MOTRIN) 600 MG  tablet Take 1 tablet (600 mg total) by mouth every 6 (six) hours as needed. 04/01/14   Derwood Kaplan, MD  lidocaine (XYLOCAINE) 2 % solution Apply 5 mLs to the rectal area 3 times per day when necessary pain 06/15/15   Joni Reining Annabella Elford, PA-C  lisinopril (PRINIVIL,ZESTRIL) 5 MG tablet Take 1 tablet (5 mg total) by mouth daily. 12/26/14   Nelva Nay, MD  oxyCODONE-acetaminophen (PERCOCET/ROXICET) 5-325 MG per tablet Take 1-2 tablets by mouth every 4 (four) hours as needed. 01/10/15   Mirian Mo, MD  senna-docusate (SENOKOT-S) 8.6-50 MG per tablet Take 1 tablet by mouth daily. 06/15/15   Ikhlas Albo, PA-C   BP 169/98 mmHg  Pulse 86  Temp(Src) 98.4 F (36.9 C) (Oral)  Resp 20  Ht 5\' 5"  (1.651 m)  Wt 140 lb (63.504 kg)  BMI 23.30 kg/m2  SpO2 100% Physical Exam  Constitutional: He is oriented to person, place, and time. He appears well-developed and well-nourished. No distress.  HENT:  Head: Normocephalic.  Eyes: Conjunctivae and EOM are normal.  Cardiovascular: Normal rate.   Pulmonary/Chest: Effort normal. No stridor.  Abdominal: Soft. Bowel sounds are normal.  Genitourinary:     Musculoskeletal: Normal range of motion.  Neurological: He is alert and oriented to person, place, and time.  Psychiatric: He has a normal mood and affect.  Nursing note and vitals reviewed.   ED Course  Procedures (including critical care time) Labs Review Labs Reviewed - No data  to display  Imaging Review No results found. I have personally reviewed and evaluated these images and lab results as part of my medical decision-making.   EKG Interpretation None      MDM   Final diagnoses:  External hemorrhoids  Elevated blood pressure    Filed Vitals:   06/15/15 2045  BP: 169/98  Pulse: 86  Temp: 98.4 F (36.9 C)  TempSrc: Oral  Resp: 20  Height:  (1.651 m)  Weight: 140 lb (63.504 kg)  SpO2: 100%    Medications  lidocaine (XYLOCAINE) 2 % jelly 1 application (not  administered)    Corey Dunn is a pleasant 33 y.o. male presenting with painful external hemorrhoid. Does not appear thrombosed. Patient will not allow me to do digital rectal exam. Patient will be given general surgery referral, advised him that his blood pressure was elevated. Resource guide given so he can establish primary care.  Evaluation does not show pathology that would require ongoing emergent intervention or inpatient treatment. Pt is hemodynamically stable and mentating appropriately. Discussed findings and plan with patient/guardian, who agrees with care plan. All questions answered. Return precautions discussed and outpatient follow up given.   New Prescriptions   LIDOCAINE (XYLOCAINE) 2 % SOLUTION    Apply 5 mLs to the rectal area 3 times per day when necessary pain   SENNA-DOCUSATE (SENOKOT-S) 8.6-50 MG PER TABLET    Take 1 tablet by mouth daily.         Wynetta Emery, PA-C 06/15/15 2100  Tilden Fossa, MD 06/15/15 2300

## 2016-04-19 ENCOUNTER — Encounter (HOSPITAL_BASED_OUTPATIENT_CLINIC_OR_DEPARTMENT_OTHER): Payer: Self-pay

## 2016-04-19 ENCOUNTER — Emergency Department (HOSPITAL_BASED_OUTPATIENT_CLINIC_OR_DEPARTMENT_OTHER)
Admission: EM | Admit: 2016-04-19 | Discharge: 2016-04-19 | Disposition: A | Discharge status: Home / Self Care | Payer: Self-pay | Attending: Emergency Medicine | Admitting: Emergency Medicine

## 2016-04-19 DIAGNOSIS — I10 Essential (primary) hypertension: Secondary | ICD-10-CM | POA: Insufficient documentation

## 2016-04-19 DIAGNOSIS — Z79899 Other long term (current) drug therapy: Secondary | ICD-10-CM | POA: Insufficient documentation

## 2016-04-19 DIAGNOSIS — K047 Periapical abscess without sinus: Secondary | ICD-10-CM | POA: Insufficient documentation

## 2016-04-19 DIAGNOSIS — F17210 Nicotine dependence, cigarettes, uncomplicated: Secondary | ICD-10-CM | POA: Insufficient documentation

## 2016-04-19 MED ORDER — CLINDAMYCIN HCL 150 MG PO CAPS
300.0000 mg | ORAL_CAPSULE | Freq: Once | ORAL | Status: AC
  Administered 2016-04-19: 300 mg via ORAL
  Filled 2016-04-19: qty 2

## 2016-04-19 MED ORDER — CLINDAMYCIN HCL 150 MG PO CAPS: 150.0000 mg | ORAL_CAPSULE | Freq: Three times a day (TID) | ORAL | Status: DC

## 2016-04-19 MED ORDER — BUPIVACAINE-EPINEPHRINE (PF) 0.5% -1:200000 IJ SOLN
1.8000 mL | Freq: Once | INTRAMUSCULAR | Status: AC
  Administered 2016-04-19: 1.8 mL
  Filled 2016-04-19: qty 1.8

## 2016-04-19 MED ORDER — LISINOPRIL 5 MG PO TABS
5.0000 mg | ORAL_TABLET | Freq: Every day | ORAL | Status: DC
Start: 2016-04-19 — End: 2016-07-15

## 2016-04-19 MED ORDER — HYDROCODONE-ACETAMINOPHEN 5-325 MG PO TABS: 1.0000 | ORAL_TABLET | Freq: Four times a day (QID) | ORAL | Status: AC | PRN

## 2016-04-19 NOTE — ED Notes (Signed)
Pt verbalizes understanding of d/c instructions and denies any further needs at this time. 

## 2016-04-19 NOTE — ED Notes (Signed)
Pt c/o dental pain on right upper jaw and right facial swelling since yesterday

## 2016-04-19 NOTE — ED Provider Notes (Signed)
CSN: [id number]     Arrival date & time 04/19/16  0251 History   First MD Initiated Contact with Patient 04/19/16 [phone]     Chief Complaint  Patient presents with  . Dental Pain     (Consider location/radiation/quality/duration/timing/severity/associated sxs/prior Treatment) HPI  This is a 35 year old male who has had a long-standing fractured right upper first bicuspid. He developed pain in that tooth yesterday which gradually became severe overnight. This morning it is associated with adjacent gum swelling. The pain radiates to the right side of his face. He describes it as throbbing. It is worse with palpation of the gum or attempts to eat or drink. There is no associated fever or lymphadenopathy.  Past Medical History  Diagnosis Date  . Hypertension   . Scoliosis    History reviewed. No pertinent past surgical history. No family history on file. Social History  Substance Use Topics  . Smoking status: Current Every Day Smoker -- 1.00 packs/day    Types: Cigarettes  . Smokeless tobacco: Never Used  . Alcohol Use: Yes    Review of Systems  All other systems reviewed and are negative.   Allergies  Amoxicillin; Ampicillin; and Penicillins  Home Medications   Prior to Admission medications   Medication Sig Start Date End Date Taking? Authorizing Provider  cyclobenzaprine (FLEXERIL) 10 MG tablet Take 1 tablet (10 mg total) by mouth 2 (two) times daily as needed for muscle spasms. 01/10/15   Mirian MoMatthew Gentry, MD  doxycycline (VIBRA-TABS) 100 MG tablet Take 1 tablet (100 mg total) by mouth 2 (two) times daily. 12/26/14   Nelva Nayobert Beaton, MD  HYDROcodone-homatropine [facility](HYCODAN) 5-1.5 MG/5ML syrup Take 5 mLs by mouth every 4 (four) hours as needed for cough. 12/26/14   Nelva Nayobert Beaton, MD  ibuprofen (ADVIL,MOTRIN) 600 MG tablet Take 1 tablet (600 mg total) by mouth every 6 (six) hours as needed. 04/01/14   Derwood KaplanAnkit Nanavati, MD  lidocaine (XYLOCAINE) 2 % solution Apply 5 mLs to the rectal area 3  times per day when necessary pain 06/15/15   Joni ReiningNicole Pisciotta, PA-C  lisinopril (PRINIVIL,ZESTRIL) 5 MG tablet Take 1 tablet (5 mg total) by mouth daily. Patient not taking: Reported on 04/19/2016 12/26/14   Nelva Nayobert Beaton, MD  oxyCODONE-acetaminophen (PERCOCET/ROXICET) 5-325 MG per tablet Take 1-2 tablets by mouth every 4 (four) hours as needed. 01/10/15   Mirian MoMatthew Gentry, MD  senna-docusate (SENOKOT-S) 8.6-50 MG per tablet Take 1 tablet by mouth daily. 06/15/15   Nicole Pisciotta, PA-C   BP 155/103 mmHg  Pulse 80  Temp(Src) 98.4 F (36.9 C) (Oral)  Resp 18  Ht 5\' 4"  (1.626 m)  Wt 145 lb (65.772 kg)  BMI 24.88 kg/m2  SpO2 100%   Physical Exam  General: Well-developed, well-nourished male in no acute distress; appearance consistent with age of record HENT: normocephalic; atraumatic; right upper first bicuspid decayed gumline with adjacent gum swelling and tenderness Eyes: Normal appearance Neck: supple; no lymphadenopathy Heart: regular rate and rhythm Lungs: clear to auscultation bilaterally Abdomen: soft; nondistended; nontender; bowel sounds present Extremities: No deformity; full range of motion; pulses normal Neurologic: Awake, alert and oriented; motor function intact in all extremities and symmetric; no facial droop Skin: Warm and dry Psychiatric: Normal mood and affect    ED Course  Procedures (including critical care time)  DENTAL BLOCK 1.8 milliliters of 0.5% bupivacaine with epinephrine were injected into the buccal fold adjacent to the right upper first bicuspid. Patient tolerated this well and there were no immediate complications. Adequate analgesia  was obtained.  MDM     Paula LibraJohn Lailany Enoch, MD 04/19/16 [phone]

## 2016-07-15 ENCOUNTER — Emergency Department (HOSPITAL_BASED_OUTPATIENT_CLINIC_OR_DEPARTMENT_OTHER)
Admission: EM | Admit: 2016-07-15 | Discharge: 2016-07-15 | Disposition: A | Discharge status: Home / Self Care | Payer: Self-pay | Attending: Emergency Medicine | Admitting: Emergency Medicine

## 2016-07-15 ENCOUNTER — Encounter (HOSPITAL_BASED_OUTPATIENT_CLINIC_OR_DEPARTMENT_OTHER): Payer: Self-pay | Admitting: Emergency Medicine

## 2016-07-15 ENCOUNTER — Emergency Department (HOSPITAL_BASED_OUTPATIENT_CLINIC_OR_DEPARTMENT_OTHER): Payer: Self-pay

## 2016-07-15 DIAGNOSIS — J111 Influenza due to unidentified influenza virus with other respiratory manifestations: Secondary | ICD-10-CM | POA: Insufficient documentation

## 2016-07-15 DIAGNOSIS — I10 Essential (primary) hypertension: Secondary | ICD-10-CM | POA: Insufficient documentation

## 2016-07-15 DIAGNOSIS — J40 Bronchitis, not specified as acute or chronic: Secondary | ICD-10-CM | POA: Insufficient documentation

## 2016-07-15 DIAGNOSIS — Z9114 Patient's other noncompliance with medication regimen: Secondary | ICD-10-CM | POA: Insufficient documentation

## 2016-07-15 DIAGNOSIS — Z79899 Other long term (current) drug therapy: Secondary | ICD-10-CM | POA: Insufficient documentation

## 2016-07-15 DIAGNOSIS — F17210 Nicotine dependence, cigarettes, uncomplicated: Secondary | ICD-10-CM | POA: Insufficient documentation

## 2016-07-15 LAB — BASIC METABOLIC PANEL
Anion gap: 6 (ref 5–15)
BUN: 12 mg/dL (ref 6–20)
CHLORIDE: 105 mmol/L (ref 101–111)
CO2: 24 mmol/L (ref 22–32)
Calcium: 9 mg/dL (ref 8.9–10.3)
Creatinine, Ser: 1.15 mg/dL (ref 0.61–1.24)
GFR calc non Af Amer: >60 mL/min (ref >60)
Glucose, Bld: 109 mg/dL — ABNORMAL HIGH (ref 65–99)
Potassium: 3.7 mmol/L (ref 3.5–5.1)
SODIUM: 135 mmol/L (ref 135–145)

## 2016-07-15 LAB — CBC WITH DIFFERENTIAL/PLATELET
BASOS PCT: 0 %
Basophils Absolute: 0 10*3/uL (ref 0.0–0.1)
EOS ABS: 0.1 10*3/uL (ref 0.0–0.7)
Eosinophils Relative: 1 %
HCT: 43.7 % (ref 39.0–52.0)
HEMOGLOBIN: 15.4 g/dL (ref 13.0–17.0)
LYMPHS ABS: 1.9 10*3/uL (ref 0.7–4.0)
Lymphocytes Relative: 19 %
MCH: 33.8 pg (ref 26.0–34.0)
MCHC: 35.2 g/dL (ref 30.0–36.0)
MCV: 96 fL (ref 78.0–100.0)
Monocytes Absolute: 0.9 10*3/uL (ref 0.1–1.0)
Monocytes Relative: 8 %
NEUTROS ABS: 7.3 10*3/uL (ref 1.7–7.7)
NEUTROS PCT: 72 %
PLATELETS: 162 10*3/uL (ref 150–400)
RBC: 4.55 MIL/uL (ref 4.22–5.81)
RDW: 12.6 % (ref 11.5–15.5)
WBC: 10.2 10*3/uL (ref 4.0–10.5)

## 2016-07-15 MED ORDER — LISINOPRIL 5 MG PO TABS: 5.0000 mg | ORAL_TABLET | Freq: Every day | ORAL | 1 refills | Status: DC

## 2016-07-15 MED ORDER — AZITHROMYCIN 250 MG PO TABS: 250.0000 mg | ORAL_TABLET | Freq: Every day | ORAL | 0 refills | Status: DC

## 2016-07-15 MED ORDER — HYDROCODONE-HOMATROPINE 5-1.5 MG/5ML PO SYRP: 5.0000 mL | ORAL_SOLUTION | Freq: Four times a day (QID) | ORAL | 0 refills | Status: AC | PRN

## 2016-07-15 MED FILL — AZITHROMYCIN 250 MG TABLET: 250 | 5 days supply | Qty: 6 | Fill #0

## 2016-07-15 MED FILL — LISINOPRIL 5 MG TABLET: 5 | 60 days supply | Qty: 60 | Fill #0

## 2016-07-15 MED FILL — HYDROCODONE-HOMATROPINE SYR: 5-1.5 | 30 days supply | Qty: 120 | Fill #0

## 2016-07-15 NOTE — ED Notes (Signed)
Patient transported to X-ray 

## 2016-07-15 NOTE — ED Notes (Signed)
MD at bedside. 

## 2016-07-15 NOTE — ED Notes (Signed)
Pt continuing to cough, c/o pain to chest and back from coughing. Delay explained to pt., comfort measures taken.

## 2016-07-15 NOTE — ED Triage Notes (Addendum)
Cough, sore throat, and body aches, chills x 3 days. Taking nyquil and thera flu.

## 2016-07-15 NOTE — ED Provider Notes (Signed)
[facility]   CSN: [id number] Arrival date & time: 07/15/16  1154     History   Chief Complaint Chief Complaint  Patient presents with  . Cough    HPI Corey Dunn is a 35 y.o. male.  HPI Patient presents with cough, sore throat and body aches with chills for 3 days.  Been taking NyQuil and TheraFlu flu.  Cough been nonproductive.  No vomiting.  No diarrhea.  Has past history of hypertension but is not aching his medication for several weeks. Past Medical History:  Diagnosis Date  . Hypertension   . Scoliosis     There are no active problems to display for this patient.   History reviewed. No pertinent surgical history.     Home Medications    Prior to Admission medications   Medication Sig Start Date End Date Taking? Authorizing Provider  azithromycin (ZITHROMAX) 250 MG tablet Take 1 tablet (250 mg total) by mouth daily. Take first 2 tablets together, then 1 every day until finished. 07/15/16   Nelva Nayobert Gila Lauf, MD  clindamycin (CLEOCIN) 150 MG capsule Take 1 capsule (150 mg total) by mouth 3 (three) times daily. 04/19/16   John Molpus, MD  HYDROcodone-acetaminophen (NORCO) 5-325 MG tablet Take 1-2 tablets by mouth every 6 (six) hours as needed (for dental pain). 04/19/16   John Molpus, MD  HYDROcodone-homatropine (HYCODAN) 5-1.5 MG/5ML syrup Take 5 mLs by mouth every 6 (six) hours as needed for cough. 07/15/16   Nelva Nayobert Kyrene Longan, MD  lisinopril (PRINIVIL,ZESTRIL) 5 MG tablet Take 1 tablet (5 mg total) by mouth daily. 07/15/16   Nelva Nayobert Alberto Schoch, MD    Family History No family history on file.  Social History Social History  Substance Use Topics  . Smoking status: Current Every Day Smoker    Packs/day: 1.00    Types: Cigarettes  . Smokeless tobacco: Never Used  . Alcohol use Yes     Allergies   Amoxicillin; Ampicillin; and Penicillins   Review of Systems Review of Systems All other systems reviewed and are negative  Physical  Exam Updated Vital Signs BP 155/97 (BP Location: Left Arm)   Pulse 64   Temp 98.3 F (36.8 C) (Oral)   Resp 20   Ht 5\' 4"  (1.626 m)   Wt 155 lb (70.3 kg)   SpO2 100%   BMI 26.61 kg/m   Physical Exam Physical Exam  Nursing note and vitals reviewed. Constitutional: He is oriented to person, place, and time. He appears well-developed and well-nourished. No distress.  HENT:  Head: Normocephalic and atraumatic.  Eyes: Pupils are equal, round, and reactive to light.  Neck: Normal range of motion.  Cardiovascular: Normal rate and intact distal pulses.   Pulmonary/Chest: No respiratory distress.  Abdominal: Normal appearance. He exhibits no distension.  Musculoskeletal: Normal range of motion.  Neurological: He is alert and oriented to person, place, and time. No cranial nerve deficit.  Skin: Skin is warm and dry. No rash noted.  Psychiatric: He has a normal mood and affect. His behavior is normal.    ED Treatments / Results  Labs (all labs ordered are listed, but only abnormal results are displayed) Labs Reviewed  BASIC METABOLIC PANEL - Abnormal; Notable for the following:       Result Value   Glucose, Bld 109 (*)    All other components within normal limits  CBC WITH DIFFERENTIAL/PLATELET    EKG  EKG Interpretation None       Radiology Dg Chest  2 View  Result Date: 07/15/2016 CLINICAL DATA:  Cough and congestion for 4 days, smoker EXAM: CHEST  2 VIEW COMPARISON:  12/26/2014 FINDINGS: Cardiomediastinal silhouette is stable. Dextroscoliosis lower thoracic spine. No infiltrate or pulmonary edema. IMPRESSION: No active cardiopulmonary disease. Electronically Signed   By: Natasha Mead M.D.   On: 07/15/2016 12:38    Procedures Procedures (including critical care time)  Medications Ordered in ED Medications - No data to display   Initial Impression / Assessment and Plan / ED Course  I have reviewed the triage vital signs and the nursing notes.  Pertinent labs &  imaging results that were available during my care of the patient were reviewed by me and considered in my medical decision making (see chart for details).  Clinical Course     Final Clinical Impressions(s) / ED Diagnoses   Final diagnoses:  Influenza  Essential hypertension  H/O medication noncompliance  Bronchitis    New Prescriptions New Prescriptions   AZITHROMYCIN (ZITHROMAX) 250 MG TABLET    Take 1 tablet (250 mg total) by mouth daily. Take first 2 tablets together, then 1 every day until finished.   HYDROCODONE-HOMATROPINE (HYCODAN) 5-1.5 MG/5ML SYRUP    Take 5 mLs by mouth every 6 (six) hours as needed for cough.     Nelva Nay, MD 07/15/16 1500

## 2017-03-18 ENCOUNTER — Encounter (HOSPITAL_BASED_OUTPATIENT_CLINIC_OR_DEPARTMENT_OTHER): Payer: Self-pay | Admitting: *Deleted

## 2017-03-18 ENCOUNTER — Emergency Department (HOSPITAL_BASED_OUTPATIENT_CLINIC_OR_DEPARTMENT_OTHER)
Admission: EM | Admit: 2017-03-18 | Discharge: 2017-03-18 | Disposition: A | Discharge status: Home / Self Care | Payer: Self-pay | Attending: Emergency Medicine | Admitting: Emergency Medicine

## 2017-03-18 ENCOUNTER — Emergency Department (HOSPITAL_BASED_OUTPATIENT_CLINIC_OR_DEPARTMENT_OTHER): Payer: Self-pay

## 2017-03-18 DIAGNOSIS — I10 Essential (primary) hypertension: Secondary | ICD-10-CM | POA: Insufficient documentation

## 2017-03-18 DIAGNOSIS — Z79899 Other long term (current) drug therapy: Secondary | ICD-10-CM | POA: Insufficient documentation

## 2017-03-18 DIAGNOSIS — F17210 Nicotine dependence, cigarettes, uncomplicated: Secondary | ICD-10-CM | POA: Insufficient documentation

## 2017-03-18 LAB — URINALYSIS, ROUTINE W REFLEX MICROSCOPIC
Glucose, UA: NEGATIVE mg/dL
Hgb urine dipstick: NEGATIVE
KETONES UR: 40 mg/dL — AB
Leukocytes, UA: NEGATIVE
NITRITE: NEGATIVE
PH: 6 (ref 5.0–8.0)
Protein, ur: NEGATIVE mg/dL
Specific Gravity, Urine: 1.027 (ref 1.005–1.030)

## 2017-03-18 LAB — COMPREHENSIVE METABOLIC PANEL
ALK PHOS: 66 U/L (ref 38–126)
ALT: 17 U/L (ref 17–63)
ANION GAP: 9 (ref 5–15)
AST: 21 U/L (ref 15–41)
Albumin: 4 g/dL (ref 3.5–5.0)
BUN: 15 mg/dL (ref 6–20)
CALCIUM: 8.9 mg/dL (ref 8.9–10.3)
CHLORIDE: 103 mmol/L (ref 101–111)
CO2: 25 mmol/L (ref 22–32)
Creatinine, Ser: 1.22 mg/dL (ref 0.61–1.24)
GFR calc Af Amer: >60 mL/min (ref >60)
GFR calc non Af Amer: >60 mL/min (ref >60)
Glucose, Bld: 119 mg/dL — ABNORMAL HIGH (ref 65–99)
Potassium: 3.3 mmol/L — ABNORMAL LOW (ref 3.5–5.1)
SODIUM: 137 mmol/L (ref 135–145)
Total Bilirubin: 0.7 mg/dL (ref 0.3–1.2)
Total Protein: 6.7 g/dL (ref 6.5–8.1)

## 2017-03-18 LAB — CBC WITH DIFFERENTIAL/PLATELET
Basophils Absolute: 0 10*3/uL (ref 0.0–0.1)
Basophils Relative: 0 %
EOS ABS: 0 10*3/uL (ref 0.0–0.7)
Eosinophils Relative: 0 %
HCT: 45.5 % (ref 39.0–52.0)
Hemoglobin: 16.8 g/dL (ref 13.0–17.0)
Lymphocytes Relative: 24 %
Lymphs Abs: 2.2 10*3/uL (ref 0.7–4.0)
MCH: 34.6 pg — ABNORMAL HIGH (ref 26.0–34.0)
MCHC: 36.9 g/dL — AB (ref 30.0–36.0)
MCV: 93.6 fL (ref 78.0–100.0)
MONOS PCT: 6 %
Monocytes Absolute: 0.6 10*3/uL (ref 0.1–1.0)
Neutro Abs: 6.2 10*3/uL (ref 1.7–7.7)
Neutrophils Relative %: 69 %
PLATELETS: 185 10*3/uL (ref 150–400)
RBC: 4.86 MIL/uL (ref 4.22–5.81)
RDW: 13 % (ref 11.5–15.5)
WBC: 9 10*3/uL (ref 4.0–10.5)

## 2017-03-18 MED ORDER — LISINOPRIL 5 MG PO TABS: 5.0000 mg | ORAL_TABLET | Freq: Every day | ORAL | 2 refills | Status: AC

## 2017-03-18 MED ORDER — PROCHLORPERAZINE EDISYLATE 5 MG/ML IJ SOLN
10.0000 mg | Freq: Once | INTRAMUSCULAR | Status: AC
Start: 2017-03-18 — End: 2017-03-18
  Administered 2017-03-18: 10 mg via INTRAVENOUS
  Filled 2017-03-18: qty 2

## 2017-03-18 MED ORDER — SODIUM CHLORIDE 0.9 % IV BOLUS (SEPSIS)
1000.0000 mL | Freq: Once | INTRAVENOUS | Status: AC
  Administered 2017-03-18: 1000 mL via INTRAVENOUS

## 2017-03-18 MED ORDER — DIPHENHYDRAMINE HCL 50 MG/ML IJ SOLN
25.0000 mg | Freq: Once | INTRAMUSCULAR | Status: AC
  Administered 2017-03-18: 25 mg via INTRAVENOUS
  Filled 2017-03-18: qty 1

## 2017-03-18 NOTE — ED Notes (Signed)
Patient transported to CT 

## 2017-03-18 NOTE — ED Notes (Signed)
Pt states he got his initial rx for BP meds in the ED and did not establish care with a PCP. Education provided about importance of medication compliance and needing to establish primary care.

## 2017-03-18 NOTE — ED Provider Notes (Signed)
[facility] Note   CSN: [id number] Arrival date & time: 03/18/17  1403   By signing my name below, I, Teofilo Pod, attest that this documentation has been prepared under the direction and in the presence of Felicie Morn, NP. Electronically Signed: Teofilo Pod, ED Scribe. 03/18/2017. 4:24 PM.   History   Chief Complaint Chief Complaint  Patient presents with  . Hypertension    The history is provided by the patient. No language interpreter was used.   HPI Comments:  Corey Dunn is a 36 y.o. male who presents to the Emergency Department complaining of an ongoing headache x 4 days. Pt reports that he ran out of his BP medication yesterday, and his BP at the ED is 167/117. He states that the pain radiates into the right side of his neck. He has taken Aleve with no relief. Pt denies other associated symptoms.   Past Medical History:  Diagnosis Date  . Hypertension   . Scoliosis     There are no active problems to display for this patient.   History reviewed. No pertinent surgical history.     Home Medications    Prior to Admission medications   Medication Sig Start Date End Date Taking? Authorizing Provider  HYDROcodone-acetaminophen (NORCO) 5-325 MG tablet Take 1-2 tablets by mouth every 6 (six) hours as needed (for dental pain). 04/19/16   Molpus, John, MD  HYDROcodone-homatropine [facility]) 5-1.5 MG/5ML syrup Take 5 mLs by mouth every 6 (six) hours as needed for cough. 07/15/16   Nelva Nay, MD  lisinopril (PRINIVIL,ZESTRIL) 5 MG tablet Take 1 tablet (5 mg total) by mouth daily. 07/15/16   Nelva Nay, MD    Family History History reviewed. No pertinent family history.  Social History Social History  Substance Use Topics  . Smoking status: Current Every Day Smoker    Packs/day: 1.00    Types: Cigarettes  . Smokeless tobacco: Never Used  . Alcohol use Yes     Allergies   Amoxicillin; Ampicillin; and  Penicillins   Review of Systems Review of Systems  Musculoskeletal: Positive for neck pain.  Neurological: Positive for headaches.  All other systems reviewed and are negative.    Physical Exam Updated Vital Signs BP (!) 167/117 (BP Location: Right Arm)   Pulse 93   Temp 98.4 F (36.9 C)   Resp 16   Ht 5\' 5"  (1.651 m)   Wt 130 lb (59 kg)   SpO2 100%   BMI 21.63 kg/m   Physical Exam  Constitutional: He is oriented to person, place, and time. He appears well-developed and well-nourished. No distress.  HENT:  Head: Normocephalic and atraumatic.  Eyes: Conjunctivae and EOM are normal.  Neck: Neck supple.  Enlarged submandibular lymph node on the right.   Cardiovascular: Normal rate, regular rhythm and normal heart sounds.   Pulmonary/Chest: Effort normal and breath sounds normal. No respiratory distress.  Abdominal: Soft. Bowel sounds are normal. He exhibits no distension.  Musculoskeletal: He exhibits no edema or tenderness.  Lymphadenopathy:    He has cervical adenopathy.  Neurological: He is alert and oriented to person, place, and time. No cranial nerve deficit.  Skin: Skin is warm and dry.  Psychiatric: He has a normal mood and affect.  Nursing note and vitals reviewed.    ED Treatments / Results  DIAGNOSTIC STUDIES:  Oxygen Saturation is 100% on RA, normal by my interpretation.    COORDINATION OF CARE:  4:18 PM Discussed treatment plan with  pt at bedside and pt agreed to plan.   Labs (all labs ordered are listed, but only abnormal results are displayed) Labs Reviewed - No data to display  EKG  EKG Interpretation None       Radiology Ct Head Wo Contrast  Result Date: 03/18/2017 CLINICAL DATA:  36 y/o  M; five days of headache and dizziness. EXAM: CT HEAD WITHOUT CONTRAST TECHNIQUE: Contiguous axial images were obtained from the base of the skull through the vertex without intravenous contrast. COMPARISON:  None. FINDINGS: Brain: No evidence of acute  infarction, hemorrhage, hydrocephalus, extra-axial collection or mass lesion/mass effect. Vascular: No hyperdense vessel or unexpected calcification. Skull: Normal. Negative for fracture or focal lesion. Sinuses/Orbits: No acute finding. Other: None. IMPRESSION: Normal CT of the head. Electronically Signed   By: Mitzi HansenLance  Furusawa-Stratton M.D.   On: 03/18/2017 17:04    Procedures Procedures (including critical care time)  Medications Ordered in ED Medications  sodium chloride 0.9 % bolus 1,000 mL (0 mLs Intravenous Stopped 03/18/17 1829)  diphenhydrAMINE (BENADRYL) injection 25 mg (25 mg Intravenous Given 03/18/17 1731)  prochlorperazine (COMPAZINE) injection 10 mg (10 mg Intravenous Given 03/18/17 1731)     Initial Impression / Assessment and Plan / ED Course  I have reviewed the triage vital signs and the nursing notes.  Pertinent labs & imaging results that were available during my care of the patient were reviewed by me and considered in my medical decision making (see chart for details).     Patient noted to be hypertensive in the emergency department.  No signs of hypertensive urgency. Discussed with patient the need for close follow-up and management by their primary care physician.   Pt HA treated and improved while in ED.  [facility]ation [facility] not [facility], Meningitis, or temporal arteritis. Pt is afebrile with no focal neuro deficits, nuchal rigidity, or change in vision. Pt verbalizes understanding and is agreeable with plan to dc.   Final Clinical Impressions(s) / ED Diagnoses   Final diagnoses:  Essential hypertension    New Prescriptions Discharge Medication List as of 03/18/2017  6:23 PM    I personally performed the services described in this documentation, which was scribed in my presence. The recorded information has been reviewed and is accurate.     Felicie MornSmith, Dezaree Tracey, NP 03/18/17 [id number]    Azalia Bilisampos, Kevin, MD 03/18/17 [phone]

## 2017-03-18 NOTE — ED Triage Notes (Signed)
Pt c/o increased bp and h/a x 2 days , out of lisinopril

## 2017-03-19 MED FILL — LISINOPRIL 5 MG TABLET: 5 | 90 days supply | Qty: 90 | Fill #0

## 2021-12-25 ENCOUNTER — Encounter (HOSPITAL_BASED_OUTPATIENT_CLINIC_OR_DEPARTMENT_OTHER): Payer: Self-pay

## 2021-12-25 ENCOUNTER — Emergency Department (HOSPITAL_BASED_OUTPATIENT_CLINIC_OR_DEPARTMENT_OTHER): Payer: Commercial Managed Care - HMO

## 2021-12-25 ENCOUNTER — Emergency Department (HOSPITAL_BASED_OUTPATIENT_CLINIC_OR_DEPARTMENT_OTHER)
Admission: EM | Admit: 2021-12-25 | Discharge: 2021-12-25 | Disposition: A | Discharge status: Home / Self Care | Payer: Commercial Managed Care - HMO | Attending: Emergency Medicine | Admitting: Emergency Medicine

## 2021-12-25 DIAGNOSIS — R059 Cough, unspecified: Secondary | ICD-10-CM | POA: Insufficient documentation

## 2021-12-25 DIAGNOSIS — I10 Essential (primary) hypertension: Secondary | ICD-10-CM | POA: Insufficient documentation

## 2021-12-25 DIAGNOSIS — R000 Tachycardia, unspecified: Secondary | ICD-10-CM | POA: Diagnosis not present

## 2021-12-25 DIAGNOSIS — R531 Weakness: Secondary | ICD-10-CM | POA: Insufficient documentation

## 2021-12-25 DIAGNOSIS — Z79899 Other long term (current) drug therapy: Secondary | ICD-10-CM | POA: Diagnosis not present

## 2021-12-25 LAB — CBC WITH DIFFERENTIAL/PLATELET
Abs Immature Granulocytes: 0.02 10*3/uL (ref 0.00–0.07)
Basophils Absolute: 0 10*3/uL (ref 0.0–0.1)
Basophils Relative: 0 %
Eosinophils Absolute: 0 10*3/uL (ref 0.0–0.5)
Eosinophils Relative: 0 %
HCT: 35.1 % — ABNORMAL LOW (ref 39.0–52.0)
Hemoglobin: 12.1 g/dL — ABNORMAL LOW (ref 13.0–17.0)
Immature Granulocytes: 0 %
Lymphocytes Relative: 15 %
Lymphs Abs: 1.4 10*3/uL (ref 0.7–4.0)
MCH: 33 pg (ref 26.0–34.0)
MCHC: 34.5 g/dL (ref 30.0–36.0)
MCV: 95.6 fL (ref 80.0–100.0)
Monocytes Absolute: 0.6 10*3/uL (ref 0.1–1.0)
Monocytes Relative: 7 %
Neutro Abs: 7.2 10*3/uL (ref 1.7–7.7)
Neutrophils Relative %: 78 %
Platelets: 220 10*3/uL (ref 150–400)
RBC: 3.67 MIL/uL — ABNORMAL LOW (ref 4.22–5.81)
RDW: 12.7 % (ref 11.5–15.5)
WBC: 9.3 10*3/uL (ref 4.0–10.5)
nRBC: 0 % (ref 0.0–0.2)

## 2021-12-25 LAB — BASIC METABOLIC PANEL
Anion gap: 5 (ref 5–15)
BUN: 26 mg/dL — ABNORMAL HIGH (ref 6–20)
CO2: 26 mmol/L (ref 22–32)
Calcium: 8.4 mg/dL — ABNORMAL LOW (ref 8.9–10.3)
Chloride: 106 mmol/L (ref 98–111)
Creatinine, Ser: 1.3 mg/dL — ABNORMAL HIGH (ref 0.61–1.24)
GFR, Estimated: >60 mL/min (ref >60)
Glucose, Bld: 113 mg/dL — ABNORMAL HIGH (ref 70–99)
Potassium: 3.8 mmol/L (ref 3.5–5.1)
Sodium: 137 mmol/L (ref 135–145)

## 2021-12-25 MED ORDER — SODIUM CHLORIDE 0.9 % IV BOLUS
1000.0000 mL | Freq: Once | INTRAVENOUS | Status: AC
  Administered 2021-12-25: 1000 mL via INTRAVENOUS

## 2021-12-25 NOTE — ED Provider Notes (Addendum)
[facility] EMERGENCY DEPARTMENT Provider Note   CSN: [id number] Arrival date & time: 12/25/21  1329     History  Chief Complaint  Patient presents with   Hypertension    Corey Dunn is a 41 y.o. male.  Patient arrives to the ED feeling lightheaded, headaches, dry mouth, high blood pressure.  No major medical problems except for high blood pressure.  States that he smoked marijuana several hours ago and has not felt well since.  However over the last several months he has been having headaches and not feeling well.  He denies any other alcohol or drug use.  States that he has not smoked marijuana in several months.  Denies any chest pain, shortness of breath.  Has had a cough.  He feels dehydrated.  He is worried he might have diabetes.  The history is provided by the patient.  Illness Severity:  Mild Timing:  Constant Chronicity:  New Associated symptoms: cough and headaches   Associated symptoms: no abdominal pain, no chest pain, no congestion, no diarrhea, no ear pain, no fatigue, no fever, no loss of consciousness, no myalgias, no nausea, no rash, no rhinorrhea, no shortness of breath, no sore throat, no vomiting and no wheezing       Home Medications Prior to Admission medications   Medication Sig Start Date End Date Taking? Authorizing Provider  HYDROcodone-acetaminophen (NORCO) 5-325 MG tablet Take 1-2 tablets by mouth every 6 (six) hours as needed (for dental pain). 04/19/16   Molpus, John, MD  HYDROcodone-homatropine [facility]) 5-1.5 MG/5ML syrup Take 5 mLs by mouth every 6 (six) hours as needed for cough. 07/15/16   Nelva Nay, MD  lisinopril (PRINIVIL,ZESTRIL) 5 MG tablet Take 1 tablet (5 mg total) by mouth daily. 03/18/17   Felicie Morn, NP      Allergies    Amoxicillin, Ampicillin, and Penicillins    Review of Systems   Review of Systems  Constitutional:  Negative for fatigue and fever.  HENT:  Negative for congestion, ear pain, rhinorrhea and sore  throat.   Respiratory:  Positive for cough. Negative for shortness of breath and wheezing.   Cardiovascular:  Negative for chest pain.  Gastrointestinal:  Negative for abdominal pain, diarrhea, nausea and vomiting.  Musculoskeletal:  Negative for myalgias.  Skin:  Negative for rash.  Neurological:  Positive for headaches. Negative for loss of consciousness.   Physical Exam Updated Vital Signs BP (!) 142/86    Pulse (!) 102    Temp 98.6 F (37 C) (Oral)    Resp 14    Ht 5\' 6"  (1.676 m)    Wt 63.5 kg    SpO2 100%    BMI 22.60 kg/m  Physical Exam Vitals and nursing note reviewed.  Constitutional:      General: He is not in acute distress.    Appearance: He is well-developed. He is not ill-appearing.  HENT:     Head: Normocephalic and atraumatic.     Nose: Nose normal.     Mouth/Throat:     Mouth: Mucous membranes are dry.  Eyes:     Extraocular Movements: Extraocular movements intact.     Conjunctiva/sclera: Conjunctivae normal.     Pupils: Pupils are equal, round, and reactive to light.  Cardiovascular:     Rate and Rhythm: Normal rate and regular rhythm.     Pulses: Normal pulses.     Heart sounds: Normal heart sounds. No murmur heard. Pulmonary:     Effort: Pulmonary effort is  normal. No respiratory distress.     Breath sounds: Normal breath sounds.  Abdominal:     Palpations: Abdomen is soft.     Tenderness: There is no abdominal tenderness.  Musculoskeletal:        General: No swelling.     Cervical back: Normal range of motion and neck supple.  Skin:    General: Skin is warm and dry.     Capillary Refill: Capillary refill takes less than 2 seconds.  Neurological:     General: No focal deficit present.     Mental Status: He is alert and oriented to person, place, and time.     Cranial Nerves: No cranial nerve deficit.     Sensory: No sensory deficit.     Motor: No weakness.     Coordination: Coordination normal.     Comments: 5+ out of 5 strength, normal sensation,  normal finger-nose-finger, normal speech, normal visual fields  Psychiatric:        Mood and Affect: Mood normal.    ED Results / Procedures / Treatments   Labs (all labs ordered are listed, but only abnormal results are displayed) Labs Reviewed  CBC WITH DIFFERENTIAL/PLATELET - Abnormal; Notable for the following components:      Result Value   RBC 3.67 (*)    Hemoglobin 12.1 (*)    HCT 35.1 (*)    All other components within normal limits  BASIC METABOLIC PANEL    EKG EKG Interpretation  Date/Time:  Tuesday December 25 2021 13:39:29 EST Ventricular Rate:  120 PR Interval:  185 QRS Duration: 94 QT Interval:  316 QTC Calculation: 447 R Axis:   79 Text Interpretation: Sinus tachycardia Confirmed by Virgina Norfolk ([id number]) on 12/25/2021 1:45:57 PM  Radiology CT Head Wo Contrast  Result Date: 12/25/2021 CLINICAL DATA:  Headache, sudden and severe.  Mental status changes. EXAM: CT HEAD WITHOUT CONTRAST TECHNIQUE: Contiguous axial images were obtained from the base of the skull through the vertex without intravenous contrast. RADIATION DOSE REDUCTION: This exam was performed according to the departmental dose-optimization program which includes automated exposure control, adjustment of the mA and/or kV according to patient size and/or use of iterative reconstruction technique. COMPARISON:  03/18/2017 FINDINGS: Brain: The brain shows a normal appearance without evidence of malformation, atrophy, old or acute small or large vessel infarction, mass lesion, hemorrhage, hydrocephalus or extra-axial collection. Vascular: No hyperdense vessel. No evidence of atherosclerotic calcification. Skull: Normal.  No traumatic finding.  No focal bone lesion. Sinuses/Orbits: Sinuses are clear. Orbits appear normal. Mastoids are clear. Other: None significant IMPRESSION: Normal head CT Electronically Signed   By: Paulina Fusi M.D.   On: 12/25/2021 14:21   DG Chest Portable 1 View  Result Date: 12/25/2021 CLINICAL  DATA:  Cough.  Mental status changes. EXAM: PORTABLE CHEST 1 VIEW COMPARISON:  07/15/2016 FINDINGS: Heart size upper limits of normal. The lungs are clear. The vascularity is normal. Chronic scoliotic curvature of the spine. IMPRESSION: No active disease. Heart size upper limits of normal. Chronic scoliosis. Electronically Signed   By: Paulina Fusi M.D.   On: 12/25/2021 14:20    Procedures Procedures    Medications Ordered in ED Medications  sodium chloride 0.9 % bolus 1,000 mL (1,000 mLs Intravenous New Bag/Given 12/25/21 1432)    ED Course/ Medical Decision Making/ A&P                           Medical Decision Making  Amount and/or Complexity of Data Reviewed Labs: ordered. Radiology: ordered.   Corey Dunn is here with high blood pressure, headaches, dry mouth.  Patient tachycardic upon arrival to 105 with blood pressure of 154/89.  Otherwise unremarkable vitals.  Does endorse smoking marijuana about 2 hours ago and now feeling like his blood pressure is high, headaches, dry mouth.  Concerned he is dehydrated.  He has not smoked marijuana in several months.  But he is felt this way when he has not smoked as well.  He is concerned about his blood sugar and his blood pressure.  He appears slightly anxious on exam but he is neurologically intact.  Heart rate was elevated when he first came in but has now improved to the 90s.  Differential diagnosis is likely marijuana related anxiety versus possibly intracranial process versus dehydration versus electrolyte abnormality.  Will allow patient to metabolize marijuana but will get CBC, BMP, chest x-ray, head CT.  He has been having some more frequent headaches and will make sure there is no intracranial process.  He has had a cough and we will check a chest x-ray.  Overall we will give IV fluid bolus to hydrate.  I reviewed and interpreted chest x-ray.  No evidence of pneumonia or pneumothorax.  I reviewed and interpreted head CT that showed no  head bleed.  Reviewed radiology report of the head CT which also confirms no acute findings.  I reviewed and interpreted his lab work that showed no significant anemia, leukocytosis.  Vital signs have normalized after IV fluids.  Heart rate is in the 80s.  Blood pressure is 124/78.  Overall suspect his symptoms today are secondary to marijuana use triggering anxiety.  BMP is pending at time of handoff to oncoming ED staff.  Suspect that this will be okay and patient can follow-up with his primary care doctor.  This chart was dictated using voice recognition software.  Despite best efforts to proofread,  errors can occur which can change the documentation meaning.    Final Clinical Impression(s) / ED Diagnoses Final diagnoses:  Weakness    Rx / DC Orders ED Discharge Orders     None         Virgina Norfolk, DO 12/25/21 1442    Virgina Norfolk, DO 12/25/21 1456

## 2021-12-25 NOTE — ED Notes (Signed)
Pt. Reports he has been feeling swimmy headed.  Pt. In no distress and states he feels like his B/P is up.   ?

## 2021-12-25 NOTE — ED Provider Notes (Signed)
Care was taken over from Dr. Lockie Mola pending BMP.  His electrolytes are nonconcerning.  His creatinine is a bit elevated.  On review of his chart, it had been elevated in the past.  Its been bouncing up and down.  I discussed this with him and he says he has an appointment with the primary care doctor tomorrow.  I advised the patient to let the primary care doctor know about this so they can follow-up.  He is currently asymptomatic.  Was discharged home in good condition.  Return precautions given ?  ?Rolan Bucco, MD ?12/25/21 1540 ? ?

## 2021-12-25 NOTE — ED Triage Notes (Addendum)
Pt reports suddenly feeling "crazy" . Checked BP states it was high. Now reports very dry mouth. Denies pain. Endorses marijuana use approx 2 hours ago ?

## 2021-12-25 NOTE — Discharge Instructions (Addendum)
As discussed, lab work today is unremarkable (other than your kidney function which was a little abnormal).  Recommend rest and continued hydration at home.  Please avoid marijuana or alcohol as well.  Follow-up with your primary care doctor.  Please return if symptoms worsen. ?

## 2023-08-21 IMAGING — CT CT HEAD W/O CM
3 series · 16 of 47 positions shown, 19 images · non-contrast
Comparison: 03/18/2017

CLINICAL DATA: Headache, sudden and severe.  Mental status changes.



[Series 2: head wo · axial · 0.39mm/px · z∈[-146,-16]mm · 10 of 32 slices shown, 13 images]
[im 3/32  brain]
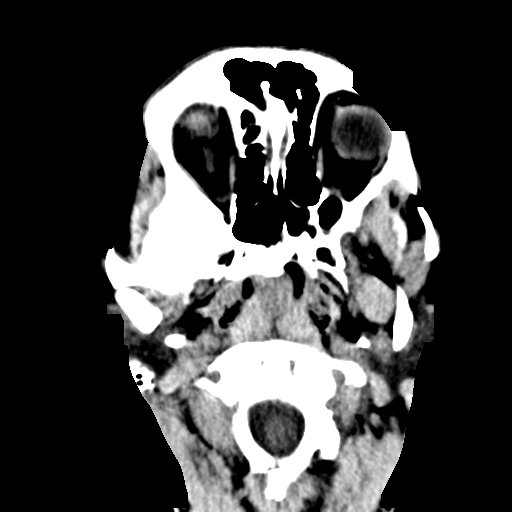
[im 3/32  bone]
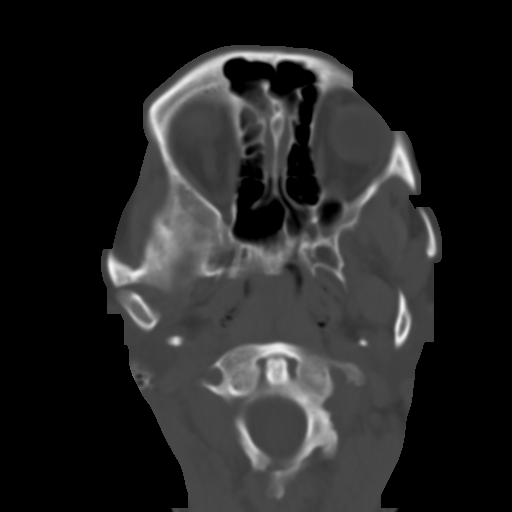
[im 6/32  brain]
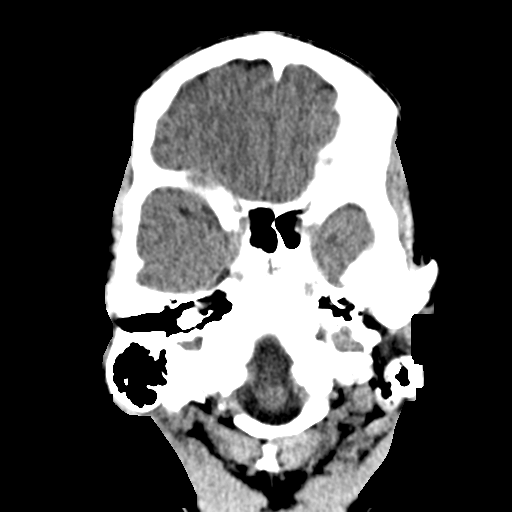
[im 9/32  brain]
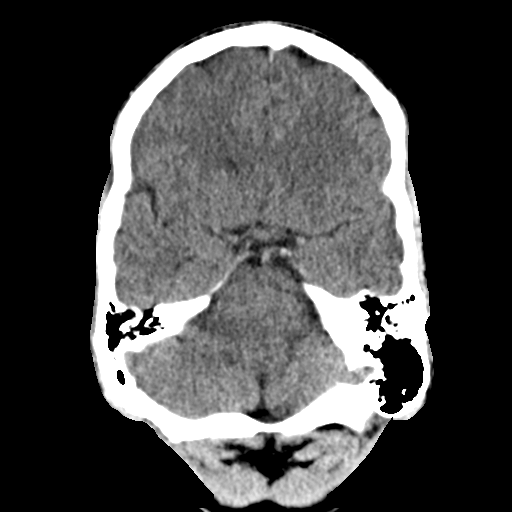
[im 11/32  brain]
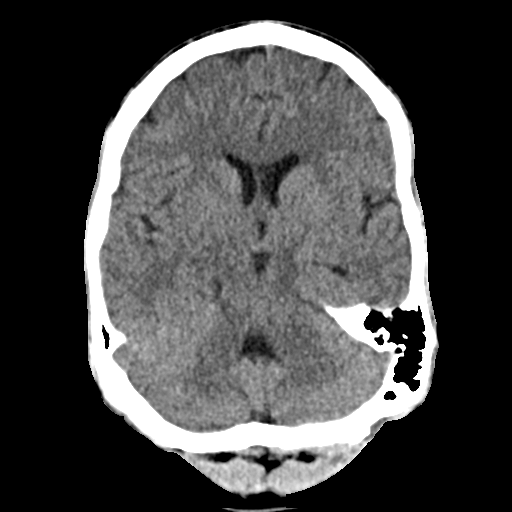
[im 14/32  brain]
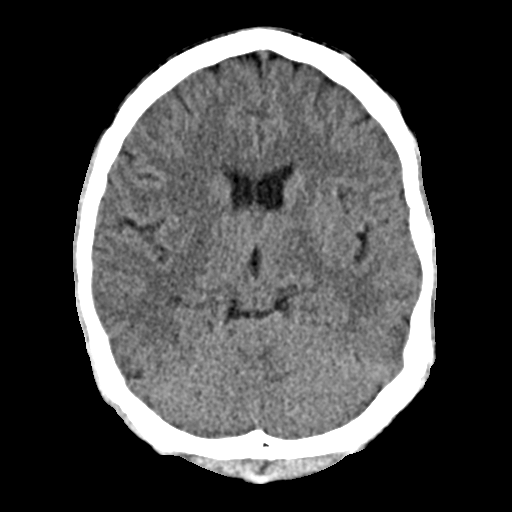
[im 14/32  bone]
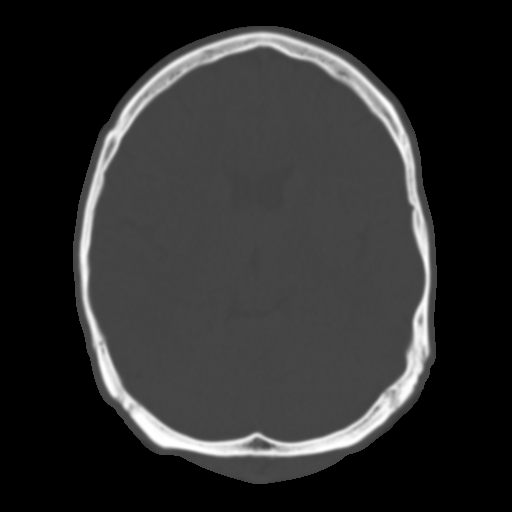
[im 18/32  brain]
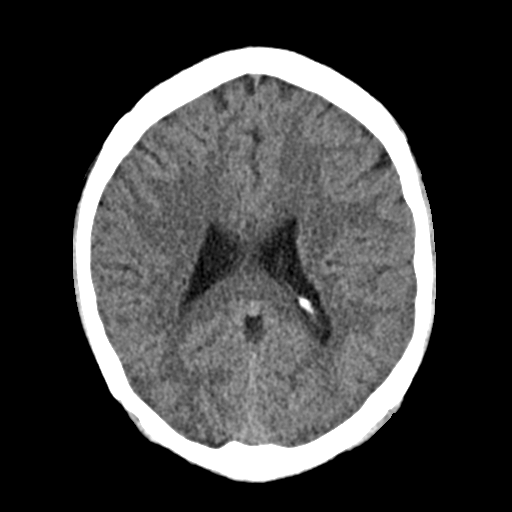
[im 21/32  brain]
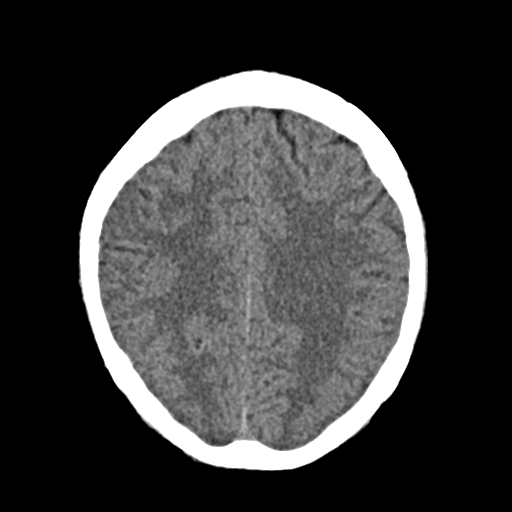
[im 24/32  brain]
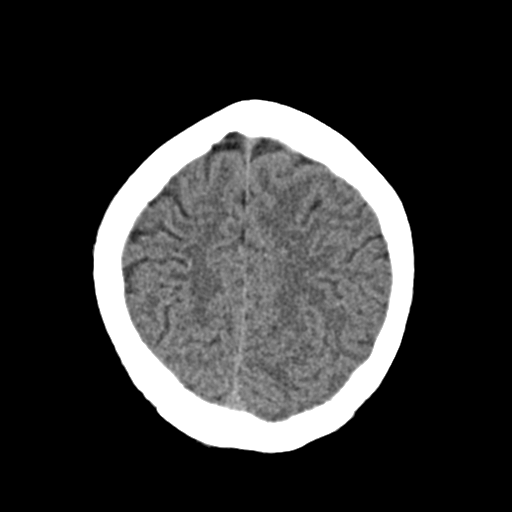
[im 26/32  brain]
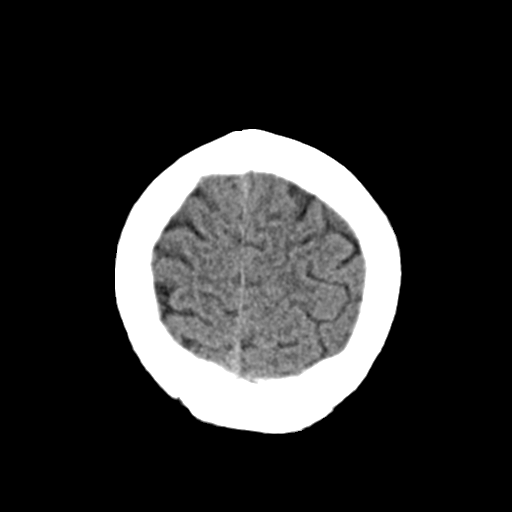
[im 26/32  bone]
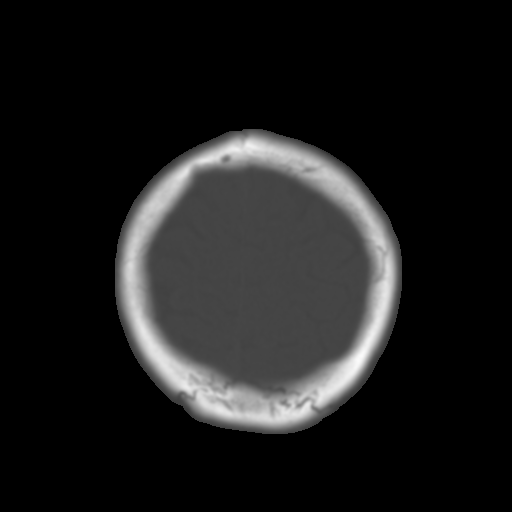
[im 29/32  brain]
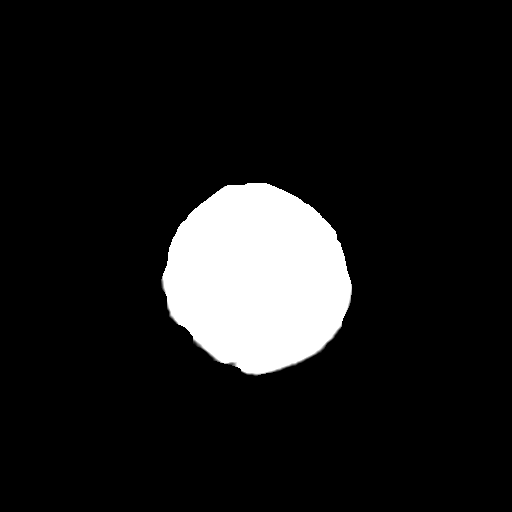

[Series 4: coronal soft · coronal · 0.35mm/px · 3 of 67 slices shown]
[im 23/67  brain]
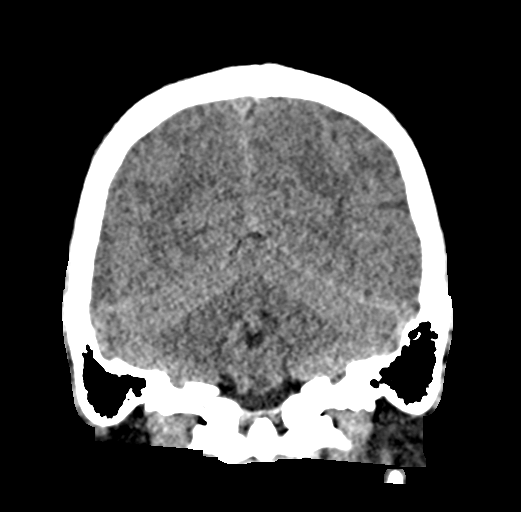
[im 30/67  brain]
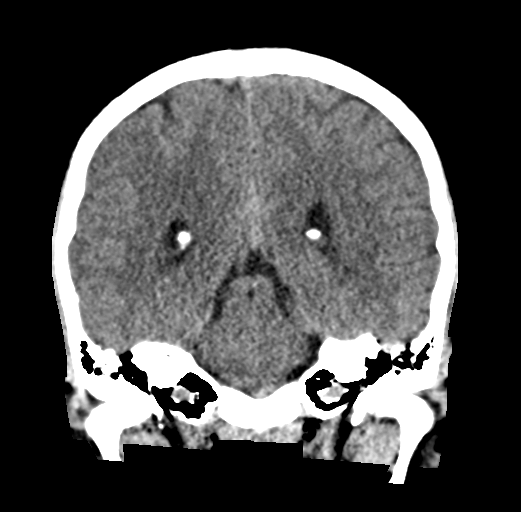
[im 37/67  brain]
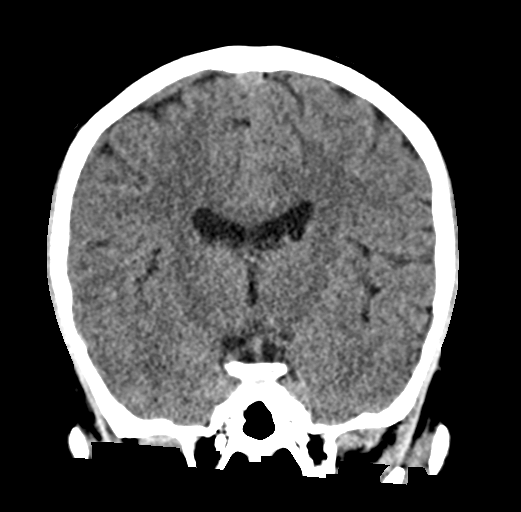

[Series 5: sag soft · sagittal · 0.32mm/px · 3 of 53 slices shown]
[im 18/53  brain]
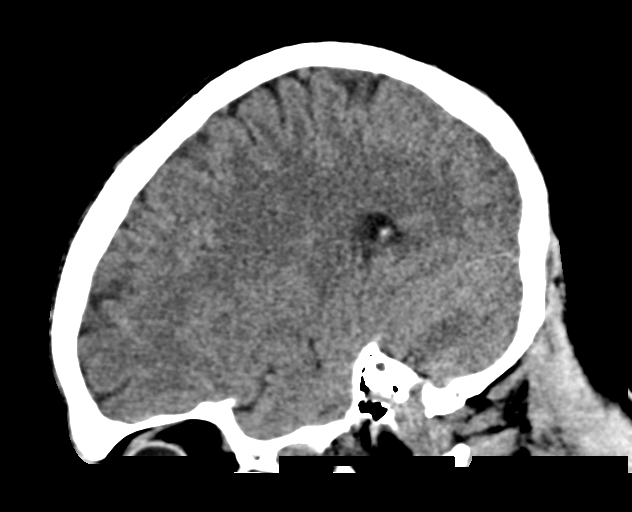
[im 27/53  brain]
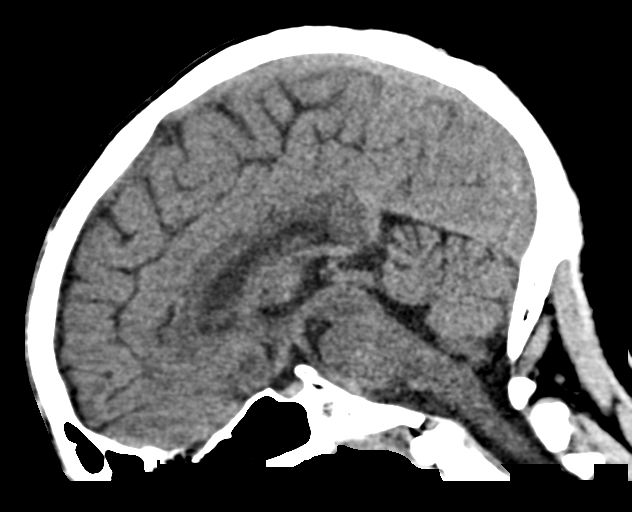
[im 35/53  brain]
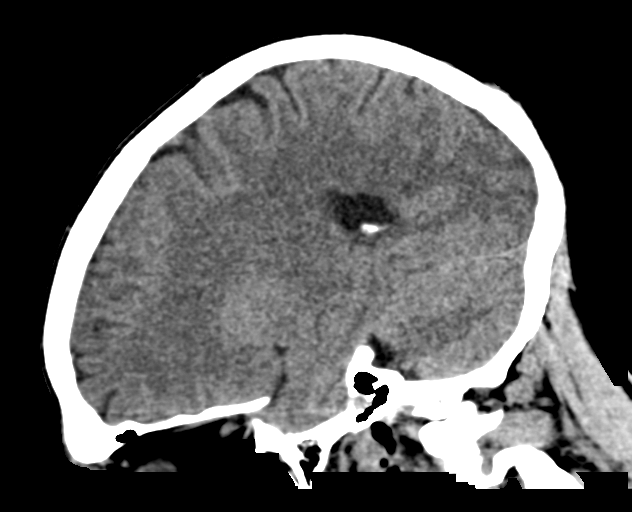

[16 of 47 positions shown; findings below may reference images not displayed]

FINDINGS: Brain: The brain shows a normal appearance without evidence of
malformation, atrophy, old or acute small or large vessel
infarction, mass lesion, hemorrhage, hydrocephalus or extra-axial
collection.

Vascular: No hyperdense vessel. No evidence of atherosclerotic
calcification.

Skull: Normal.  No traumatic finding.  No focal bone lesion.

Sinuses/Orbits: Sinuses are clear. Orbits appear normal. Mastoids
are clear.

Other: None significant
IMPRESSION: Normal head CT
